# Patient Record
Sex: Female | Born: 1975 | Race: Black or African American | Hispanic: No | Marital: Married | Smoking: Never smoker
Health system: Southern US, Community
[De-identification: ages and names within clinical notes are randomized; demographics above are authoritative.]

---

## 1998-03-09 ENCOUNTER — Other Ambulatory Visit: Admission: RE | Admit: 1998-03-09 | Discharge: 1998-03-09 | Payer: Self-pay | Admitting: Obstetrics and Gynecology

## 1998-11-07 ENCOUNTER — Encounter: Admission: RE | Admit: 1998-11-07 | Discharge: 1999-02-05 | Payer: Self-pay | Admitting: Gynecology

## 1999-01-18 ENCOUNTER — Encounter: Payer: Self-pay | Admitting: *Deleted

## 1999-01-18 ENCOUNTER — Ambulatory Visit (HOSPITAL_COMMUNITY): Admission: RE | Admit: 1999-01-18 | Discharge: 1999-01-18 | Payer: Self-pay | Admitting: *Deleted

## 1999-05-14 ENCOUNTER — Inpatient Hospital Stay (HOSPITAL_COMMUNITY): Admission: AD | Admit: 1999-05-14 | Discharge: 1999-05-14 | Payer: Self-pay | Admitting: *Deleted

## 1999-06-14 ENCOUNTER — Inpatient Hospital Stay (HOSPITAL_COMMUNITY): Admission: AD | Admit: 1999-06-14 | Discharge: 1999-06-14 | Payer: Self-pay | Admitting: *Deleted

## 1999-06-18 ENCOUNTER — Encounter (HOSPITAL_COMMUNITY): Admission: RE | Admit: 1999-06-18 | Discharge: 1999-06-21 | Payer: Self-pay | Admitting: *Deleted

## 1999-06-20 ENCOUNTER — Inpatient Hospital Stay (HOSPITAL_COMMUNITY): Admission: AD | Admit: 1999-06-20 | Discharge: 1999-06-22 | Payer: Self-pay | Admitting: *Deleted

## 2004-01-12 ENCOUNTER — Other Ambulatory Visit: Admission: RE | Admit: 2004-01-12 | Discharge: 2004-01-12 | Payer: Self-pay | Admitting: Family Medicine

## 2005-03-18 ENCOUNTER — Other Ambulatory Visit: Admission: RE | Admit: 2005-03-18 | Discharge: 2005-03-18 | Payer: Self-pay | Admitting: Family Medicine

## 2005-08-01 ENCOUNTER — Emergency Department (HOSPITAL_COMMUNITY): Admission: EM | Admit: 2005-08-01 | Discharge: 2005-08-01 | Payer: Self-pay | Admitting: Emergency Medicine

## 2005-08-04 ENCOUNTER — Encounter: Admission: RE | Admit: 2005-08-04 | Discharge: 2005-08-04 | Payer: Self-pay | Admitting: Family Medicine

## 2006-05-06 ENCOUNTER — Other Ambulatory Visit: Admission: RE | Admit: 2006-05-06 | Discharge: 2006-05-06 | Payer: Self-pay | Admitting: Obstetrics and Gynecology

## 2006-06-02 ENCOUNTER — Inpatient Hospital Stay (HOSPITAL_COMMUNITY): Admission: AD | Admit: 2006-06-02 | Discharge: 2006-06-02 | Payer: Self-pay | Admitting: Obstetrics and Gynecology

## 2007-03-04 ENCOUNTER — Ambulatory Visit (HOSPITAL_COMMUNITY): Admission: RE | Admit: 2007-03-04 | Discharge: 2007-03-04 | Payer: Self-pay | Admitting: Obstetrics and Gynecology

## 2007-05-19 ENCOUNTER — Other Ambulatory Visit: Admission: RE | Admit: 2007-05-19 | Discharge: 2007-05-19 | Payer: Self-pay | Admitting: Obstetrics and Gynecology

## 2008-03-16 ENCOUNTER — Inpatient Hospital Stay (HOSPITAL_COMMUNITY): Admission: RE | Admit: 2008-03-16 | Discharge: 2008-03-18 | Payer: Self-pay | Admitting: Obstetrics and Gynecology

## 2008-06-19 ENCOUNTER — Other Ambulatory Visit: Admission: RE | Admit: 2008-06-19 | Discharge: 2008-06-19 | Payer: Self-pay | Admitting: Obstetrics and Gynecology

## 2009-12-18 ENCOUNTER — Other Ambulatory Visit: Admission: RE | Admit: 2009-12-18 | Discharge: 2009-12-18 | Payer: Self-pay | Admitting: Obstetrics and Gynecology

## 2010-11-19 NOTE — H&P (Signed)
NAME:  Karen Trevino, HECKART NO.:  [id number]   MEDICAL RECORD NO.:  [id number]          PATIENT TYPE:  INP   LOCATION:                                FACILITY:  [facility]   PHYSICIAN:  Gerald Leitz, MD          DATE OF BIRTH:  1976-05-12   DATE OF ADMISSION:  03/16/2008  DATE OF DISCHARGE:                              HISTORY & PHYSICAL   HISTORY OF PRESENT ILLNESS:  This is a 35 year old G23, P1-0-2-1-1 at 86  weeks' and 6 days' intrauterine pregnancy based on the last menstrual  period of June 04, 2007, confirmed by an 8-week ultrasound with  estimated date of delivery March 10, 2008.  Pregnancy complicated by  history of LEEP procedure.  The patient is post date and admitted for  induction.  Positive fetal movement.  No leakage of fluid.  No vaginal  bleeding.  No regular contractions.  GBS status is negative.   PAST OBSTETRIC HISTORY:  Spontaneous vaginal delivery in 2000, birth  weight was 8 pounds 7 ounces, miscarriage in November 2007 and August  2008.   PAST MEDICAL HISTORY:  Obesity.   PAST SURGICAL HISTORY:  LEEP procedure in 1990.   PAST GYNECOLOGIC HISTORY:  History of LEEP.  Last Pap smear in November  2008, this was normal.  History of HPV.  No other STDs.   MEDICATIONS:  Prenatal vitamins and iron sulfate.   ALLERGIES:  No known drug allergies.   SOCIAL HISTORY:  The patient is married.  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  She denies tobacco, alcohol, or illicit drug use.   FAMILY HISTORY:  Noncontributory.   PHYSICAL EXAMINATION:  VITAL SIGNS:  Blood pressure 130/78, weight is 60  pounds, fetal heart rate 140.  CARDIOVASCULAR:  Regular rate and rhythm.  LUNGS: Clear to auscultation bilaterally.  ABDOMEN:  Gravid, nontender.  EXTREMITIES:  1+ edema.  CERVIX:  2 cm, 50% of effaced, and ballotable.   LABORATORY RESULTS:  GBS negative.  HIV nonreactive.  Blood type is A+.  RPR nonreactive.  Rubella immune.  Hepatitis B negative.   ASSESSMENT AND PLAN:  A 40-week 6-day intrauterine pregnancy, post date  for induction.   PLAN:  Cytotec per vagina followed by probable Pitocin, anticipate  spontaneous vaginal delivery.      Gerald Leitz, MD  Electronically Signed     TC/MEDQ  D:  03/15/2008  T:  03/16/2008  Job:  [id number]

## 2011-04-09 LAB — CBC
HCT: 27.8 — ABNORMAL LOW
Hemoglobin: 10.9 — ABNORMAL LOW
Hemoglobin: 8.9 — ABNORMAL LOW
MCHC: 31.7
MCHC: 32.1
MCV: 79.4
MCV: 79.6
Platelets: 217
RBC: 3.49 — ABNORMAL LOW
RBC: 4.34
RDW: 17.6 — ABNORMAL HIGH
WBC: 10.1

## 2011-04-09 LAB — SYPHILIS: RPR W/REFLEX TO RPR TITER AND TREPONEMAL ANTIBODIES, TRADITIONAL SCREENING AND DIAGNOSIS ALGORITHM: RPR Ser Ql: NONREACTIVE

## 2011-07-02 ENCOUNTER — Other Ambulatory Visit: Payer: Self-pay | Admitting: Physician Assistant

## 2011-07-02 ENCOUNTER — Other Ambulatory Visit (HOSPITAL_COMMUNITY)
Admission: RE | Admit: 2011-07-02 | Discharge: 2011-07-02 | Disposition: A | Discharge status: Home / Self Care | Payer: Self-pay | Source: Ambulatory Visit | Attending: Family Medicine | Admitting: Family Medicine

## 2011-07-02 DIAGNOSIS — Z124 Encounter for screening for malignant neoplasm of cervix: Secondary | ICD-10-CM | POA: Insufficient documentation

## 2012-11-26 ENCOUNTER — Other Ambulatory Visit: Payer: Self-pay | Admitting: Physician Assistant

## 2012-11-26 ENCOUNTER — Other Ambulatory Visit (HOSPITAL_COMMUNITY)
Admission: RE | Admit: 2012-11-26 | Discharge: 2012-11-26 | Disposition: A | Discharge status: Home / Self Care | Payer: BC Managed Care – PPO | Source: Ambulatory Visit | Attending: Family Medicine | Admitting: Family Medicine

## 2012-11-26 DIAGNOSIS — Z124 Encounter for screening for malignant neoplasm of cervix: Secondary | ICD-10-CM | POA: Insufficient documentation

## 2014-02-16 ENCOUNTER — Other Ambulatory Visit: Payer: Self-pay | Admitting: Physician Assistant

## 2014-02-16 ENCOUNTER — Other Ambulatory Visit (HOSPITAL_COMMUNITY)
Admission: RE | Admit: 2014-02-16 | Discharge: 2014-02-16 | Disposition: A | Discharge status: Home / Self Care | Payer: BC Managed Care – PPO | Source: Ambulatory Visit | Attending: Family Medicine | Admitting: Family Medicine

## 2014-02-16 DIAGNOSIS — Z124 Encounter for screening for malignant neoplasm of cervix: Secondary | ICD-10-CM | POA: Diagnosis present

## 2014-02-17 LAB — CYTOLOGY - PAP

## 2014-11-13 ENCOUNTER — Emergency Department (HOSPITAL_COMMUNITY)
Admission: EM | Admit: 2014-11-13 | Discharge: 2014-11-14 | Disposition: A | Discharge status: Home / Self Care | Payer: BLUE CROSS/BLUE SHIELD | Attending: Emergency Medicine | Admitting: Emergency Medicine

## 2014-11-13 ENCOUNTER — Encounter (HOSPITAL_COMMUNITY): Payer: Self-pay | Admitting: Nurse Practitioner

## 2014-11-13 ENCOUNTER — Emergency Department (HOSPITAL_COMMUNITY): Payer: BLUE CROSS/BLUE SHIELD

## 2014-11-13 DIAGNOSIS — R112 Nausea with vomiting, unspecified: Secondary | ICD-10-CM | POA: Insufficient documentation

## 2014-11-13 DIAGNOSIS — Z3202 Encounter for pregnancy test, result negative: Secondary | ICD-10-CM | POA: Diagnosis not present

## 2014-11-13 DIAGNOSIS — R0789 Other chest pain: Secondary | ICD-10-CM | POA: Diagnosis not present

## 2014-11-13 DIAGNOSIS — R0602 Shortness of breath: Secondary | ICD-10-CM | POA: Insufficient documentation

## 2014-11-13 DIAGNOSIS — R079 Chest pain, unspecified: Secondary | ICD-10-CM | POA: Diagnosis present

## 2014-11-13 LAB — CBC
HCT: 41 % (ref 36.0–46.0)
Hemoglobin: 13.3 g/dL (ref 12.0–15.0)
MCH: 25.8 pg — AB (ref 26.0–34.0)
MCHC: 32.4 g/dL (ref 30.0–36.0)
MCV: 79.5 fL (ref 78.0–100.0)
PLATELETS: 263 10*3/uL (ref 150–400)
RBC: 5.16 MIL/uL — AB (ref 3.87–5.11)
RDW: 14.6 % (ref 11.5–15.5)
WBC: 7 10*3/uL (ref 4.0–10.5)

## 2014-11-13 LAB — I-STAT TROPONIN, ED
TROPONIN I, POC: 0 ng/mL (ref 0.00–0.08)
TROPONIN I, POC: 0 ng/mL (ref 0.00–0.08)

## 2014-11-13 LAB — BASIC METABOLIC PANEL
ANION GAP: 9 (ref 5–15)
BUN: 11 mg/dL (ref 6–20)
CHLORIDE: 105 mmol/L (ref 101–111)
CO2: 20 mmol/L — ABNORMAL LOW (ref 22–32)
CREATININE: 0.91 mg/dL (ref 0.44–1.00)
Calcium: 9.3 mg/dL (ref 8.9–10.3)
GFR calc non Af Amer: >60 mL/min (ref >60)
Glucose, Bld: 102 mg/dL — ABNORMAL HIGH (ref 70–99)
POTASSIUM: 3.5 mmol/L (ref 3.5–5.1)
SODIUM: 134 mmol/L — AB (ref 135–145)

## 2014-11-13 LAB — D-DIMER, QUANTITATIVE (NOT AT ARMC): D DIMER QUANT: 0.72 ug{FEU}/mL — AB (ref 0.00–0.48)

## 2014-11-13 LAB — LIPASE, BLOOD: LIPASE: 16 U/L — AB (ref 22–51)

## 2014-11-13 MED ORDER — ASPIRIN EC 325 MG PO TBEC
325.0000 mg | DELAYED_RELEASE_TABLET | Freq: Once | ORAL | Status: AC
  Administered 2014-11-13: 325 mg via ORAL
  Filled 2014-11-13: qty 1

## 2014-11-13 MED ORDER — SODIUM CHLORIDE 0.9 % IV BOLUS (SEPSIS)
1000.0000 mL | Freq: Once | INTRAVENOUS | Status: AC
  Administered 2014-11-13: 1000 mL via INTRAVENOUS

## 2014-11-13 MED ORDER — ONDANSETRON HCL 4 MG/2ML IJ SOLN
4.0000 mg | Freq: Once | INTRAMUSCULAR | Status: AC
  Administered 2014-11-13: 4 mg via INTRAVENOUS
  Filled 2014-11-13: qty 2

## 2014-11-13 MED ORDER — GI COCKTAIL ~~LOC~~
30.0000 mL | Freq: Once | ORAL | Status: AC
  Administered 2014-11-14: 30 mL via ORAL
  Filled 2014-11-13: qty 30

## 2014-11-13 NOTE — ED Notes (Signed)
Pt updated on plan of care,  

## 2014-11-13 NOTE — ED Notes (Signed)
MD at bedside. 

## 2014-11-13 NOTE — ED Notes (Signed)
She had an episode of sharp chest pain that resolved yesterday. Today she woke with n/v and then this afternoon began to have sharp pain in midsternal and L side of chest. She describes the pain as tight now. A&Ox4, resp e/u

## 2014-11-14 ENCOUNTER — Emergency Department (HOSPITAL_COMMUNITY): Payer: BLUE CROSS/BLUE SHIELD

## 2014-11-14 ENCOUNTER — Encounter (HOSPITAL_COMMUNITY): Payer: Self-pay

## 2014-11-14 LAB — HEPATIC FUNCTION PANEL
ALBUMIN: 3.7 g/dL (ref 3.5–5.0)
ALK PHOS: 85 U/L (ref 38–126)
ALT: 12 U/L — AB (ref 14–54)
AST: 18 U/L (ref 15–41)
Bilirubin, Direct: <0.1 mg/dL — ABNORMAL LOW (ref 0.1–0.5)
TOTAL PROTEIN: 7.5 g/dL (ref 6.5–8.1)
Total Bilirubin: 0.7 mg/dL (ref 0.3–1.2)

## 2014-11-14 LAB — POC URINE PREG, ED: Preg Test, Ur: NEGATIVE

## 2014-11-14 MED ORDER — IOHEXOL 350 MG/ML SOLN
100.0000 mL | Freq: Once | INTRAVENOUS | Status: AC | PRN
  Administered 2014-11-14: 100 mL via INTRAVENOUS

## 2014-11-14 NOTE — ED Notes (Signed)
Patient transported to CT 

## 2014-11-14 NOTE — ED Provider Notes (Signed)
CSN: [id number]     Arrival date & time 11/13/14  1737 History   First MD Initiated Contact with Patient 11/13/14 2143     Chief Complaint  Patient presents with  . Chest Pain     (Consider location/radiation/quality/duration/timing/severity/associated sxs/prior Treatment) Patient is a 39 y.o. female presenting with chest pain. The history is provided by the patient.  Chest Pain Pain location:  Substernal area Pain quality: sharp   Pain radiates to:  Does not radiate Pain radiates to the back: no   Pain severity:  Moderate Onset quality:  Gradual Duration:  1 day Timing:  Constant Progression:  Unchanged Chronicity:  New Context: at rest   Relieved by:  Nothing Worsened by:  Nothing tried Ineffective treatments:  None tried Associated symptoms: nausea, shortness of breath and vomiting   Associated symptoms: no abdominal pain, no altered mental status, no back pain, no claudication, no cough, no diaphoresis, no dizziness, no fatigue, no fever, no headache, no lower extremity edema, no near-syncope, no palpitations, no syncope and no weakness   Risk factors: obesity   Risk factors: no prior DVT/PE, no smoking and no surgery     History reviewed. No pertinent past medical history. History reviewed. No pertinent past surgical history. History reviewed. No pertinent family history. History  Substance Use Topics  . Smoking status: Never Smoker   . Smokeless tobacco: Not on file  . Alcohol Use: No   OB History    No data available     Review of Systems  Constitutional: Negative for fever, chills, diaphoresis, appetite change and fatigue.  HENT: Negative for congestion and rhinorrhea.   Respiratory: Positive for shortness of breath. Negative for cough, chest tightness and wheezing.   Cardiovascular: Positive for chest pain. Negative for palpitations, claudication, leg swelling, syncope and near-syncope.  Gastrointestinal: Positive for nausea and vomiting. Negative for  abdominal pain and diarrhea.  Musculoskeletal: Negative for back pain, neck pain and neck stiffness.  Skin: Negative for color change, pallor and rash.  Neurological: Negative for dizziness, weakness, light-headedness and headaches.  All other systems reviewed and are negative.     Allergies  Review of patient's allergies indicates no known allergies.  Home Medications   Prior to Admission medications   Medication Sig Start Date End Date Taking? Authorizing Provider  ibuprofen (ADVIL,MOTRIN) 200 MG tablet Take 600 mg by mouth every 6 (six) hours as needed for headache.   Yes Historical Provider, MD   BP 124/72 mmHg  Pulse 92  Temp(Src) 98.1 F (36.7 C) (Oral)  Resp 20  SpO2 100% Physical Exam  Constitutional: She is oriented to person, place, and time. She appears well-developed and well-nourished. No distress.  HENT:  Head: Normocephalic and atraumatic.  Mouth/Throat: Oropharynx is clear and moist.  Eyes: Conjunctivae and EOM are normal. Pupils are equal, round, and reactive to light.  Neck: Normal range of motion. Neck supple.  Cardiovascular: Normal rate, regular rhythm, normal heart sounds and intact distal pulses.  Exam reveals no gallop and no friction rub.   No murmur heard. Pulmonary/Chest: Effort normal and breath sounds normal. No respiratory distress. She has no wheezes. She has no rales. She exhibits no tenderness.  Abdominal: Soft. Bowel sounds are normal. She exhibits no distension. There is no tenderness. There is no rebound and no guarding.  Musculoskeletal: Normal range of motion. She exhibits no edema or tenderness.  Neurological: She is alert and oriented to person, place, and time. GCS eye subscore is 4. GCS verbal  subscore is 5. GCS motor subscore is 6.  Skin: She is not diaphoretic.  Nursing note and vitals reviewed.   ED Course  Procedures (including critical care time) Labs Review Labs Reviewed  CBC - Abnormal; Notable for the following:    RBC  5.16 (*)    MCH 25.8 (*)    All other components within normal limits  BASIC METABOLIC PANEL - Abnormal; Notable for the following:    Sodium 134 (*)    CO2 20 (*)    Glucose, Bld 102 (*)    All other components within normal limits  D-DIMER, QUANTITATIVE - Abnormal; Notable for the following:    D-Dimer, Quant 0.72 (*)    All other components within normal limits  LIPASE, BLOOD - Abnormal; Notable for the following:    Lipase 16 (*)    All other components within normal limits  HEPATIC FUNCTION PANEL - Abnormal; Notable for the following:    ALT 12 (*)    Bilirubin, Direct <0.1 (*)    All other components within normal limits  I-STAT TROPOININ, ED  I-STAT TROPOININ, ED  POC URINE PREG, ED    Imaging Review No results found.   EKG Interpretation   Date/Time:  Monday Nov 13 2014 17:43:01 EDT Ventricular Rate:  107 PR Interval:  174 QRS Duration: 82 QT Interval:  328 QTC Calculation: 437 R Axis:   35 Text Interpretation:  Sinus tachycardia Possible Anterior infarct , age  undetermined Abnormal ECG No previous ECGs available Confirmed by Manus GunningANCOUR   MD, STEPHEN [phone]([id number]) on 11/13/2014 9:59:39 PM      MDM   Final diagnoses:  Other chest pain    39 yo F with no significant PMH presenting with chest pain. Had one episode of chest pain yesterday which resolved spontaneously.  Today reports onset of substernal chest pain again, this time persistent so came to ED.  Pain sharp, non-radiating, with pleuritic component.  She reports developing non-bilious/non-bloody emesis and nausea following this.  No abdominal pain.  Mild SOB.  No LE edema, calf pain or swelling.  No prior h/o VTE, OCP use, recent immobilizations.  On presentation, pt alert, VSS, in NAD.  CV and lung exam WNL.  Abdomen soft, non-tender, non-distended. No LE edema or s/sx of DVT.  Exam otherwise WNL.  EKG obtained as above- sinus tachycardia, non-specific T wave abnormality, no acute ischemic changes.  Low risk for  ACS per HEAR score- plan for delta troponin.  Will check d-dimer for possible DVT.  Also possible viral illness in setting of N/V.  Workup significant for positive d-dimer.  Delta troponin negative.  CXR WNL.  Plan for CTA chest to r/o PE.  Care of pt to oncoming ED team- plan for d/c with PCP f/u if CT negative.  Discussed with attending Dr. Manus Gunningancour.    Jodean LimaEmily Mata Rowen, MD 11/17/14 1004  Glynn OctaveStephen Rancour, MD 11/17/14 [phone]

## 2014-11-14 NOTE — Discharge Instructions (Signed)

## 2015-05-29 ENCOUNTER — Other Ambulatory Visit: Payer: Self-pay | Admitting: Physician Assistant

## 2015-05-29 ENCOUNTER — Other Ambulatory Visit (HOSPITAL_COMMUNITY)
Admission: RE | Admit: 2015-05-29 | Discharge: 2015-05-29 | Disposition: A | Discharge status: Home / Self Care | Payer: BLUE CROSS/BLUE SHIELD | Source: Ambulatory Visit | Attending: Family Medicine | Admitting: Family Medicine

## 2015-05-29 DIAGNOSIS — Z124 Encounter for screening for malignant neoplasm of cervix: Secondary | ICD-10-CM | POA: Diagnosis present

## 2015-06-04 LAB — CYTOLOGY - PAP

## 2015-12-23 DIAGNOSIS — J019 Acute sinusitis, unspecified: Secondary | ICD-10-CM | POA: Diagnosis not present

## 2016-04-14 ENCOUNTER — Other Ambulatory Visit: Payer: Self-pay | Admitting: Pediatrics

## 2016-06-18 DIAGNOSIS — Z23 Encounter for immunization: Secondary | ICD-10-CM | POA: Diagnosis not present

## 2016-06-18 DIAGNOSIS — Z0000 Encounter for general adult medical examination without abnormal findings: Secondary | ICD-10-CM | POA: Diagnosis not present

## 2016-06-18 DIAGNOSIS — E669 Obesity, unspecified: Secondary | ICD-10-CM | POA: Diagnosis not present

## 2016-08-06 DIAGNOSIS — Z1231 Encounter for screening mammogram for malignant neoplasm of breast: Secondary | ICD-10-CM | POA: Diagnosis not present

## 2017-01-14 IMAGING — DX DG CHEST 2V
2 series · 2 of 2 positions shown · non-contrast
Comparison: None.

CLINICAL DATA: Intermittent chest pain today.  Shortness of breath.

EXAM:
CHEST  2 VIEW

[chest pa]
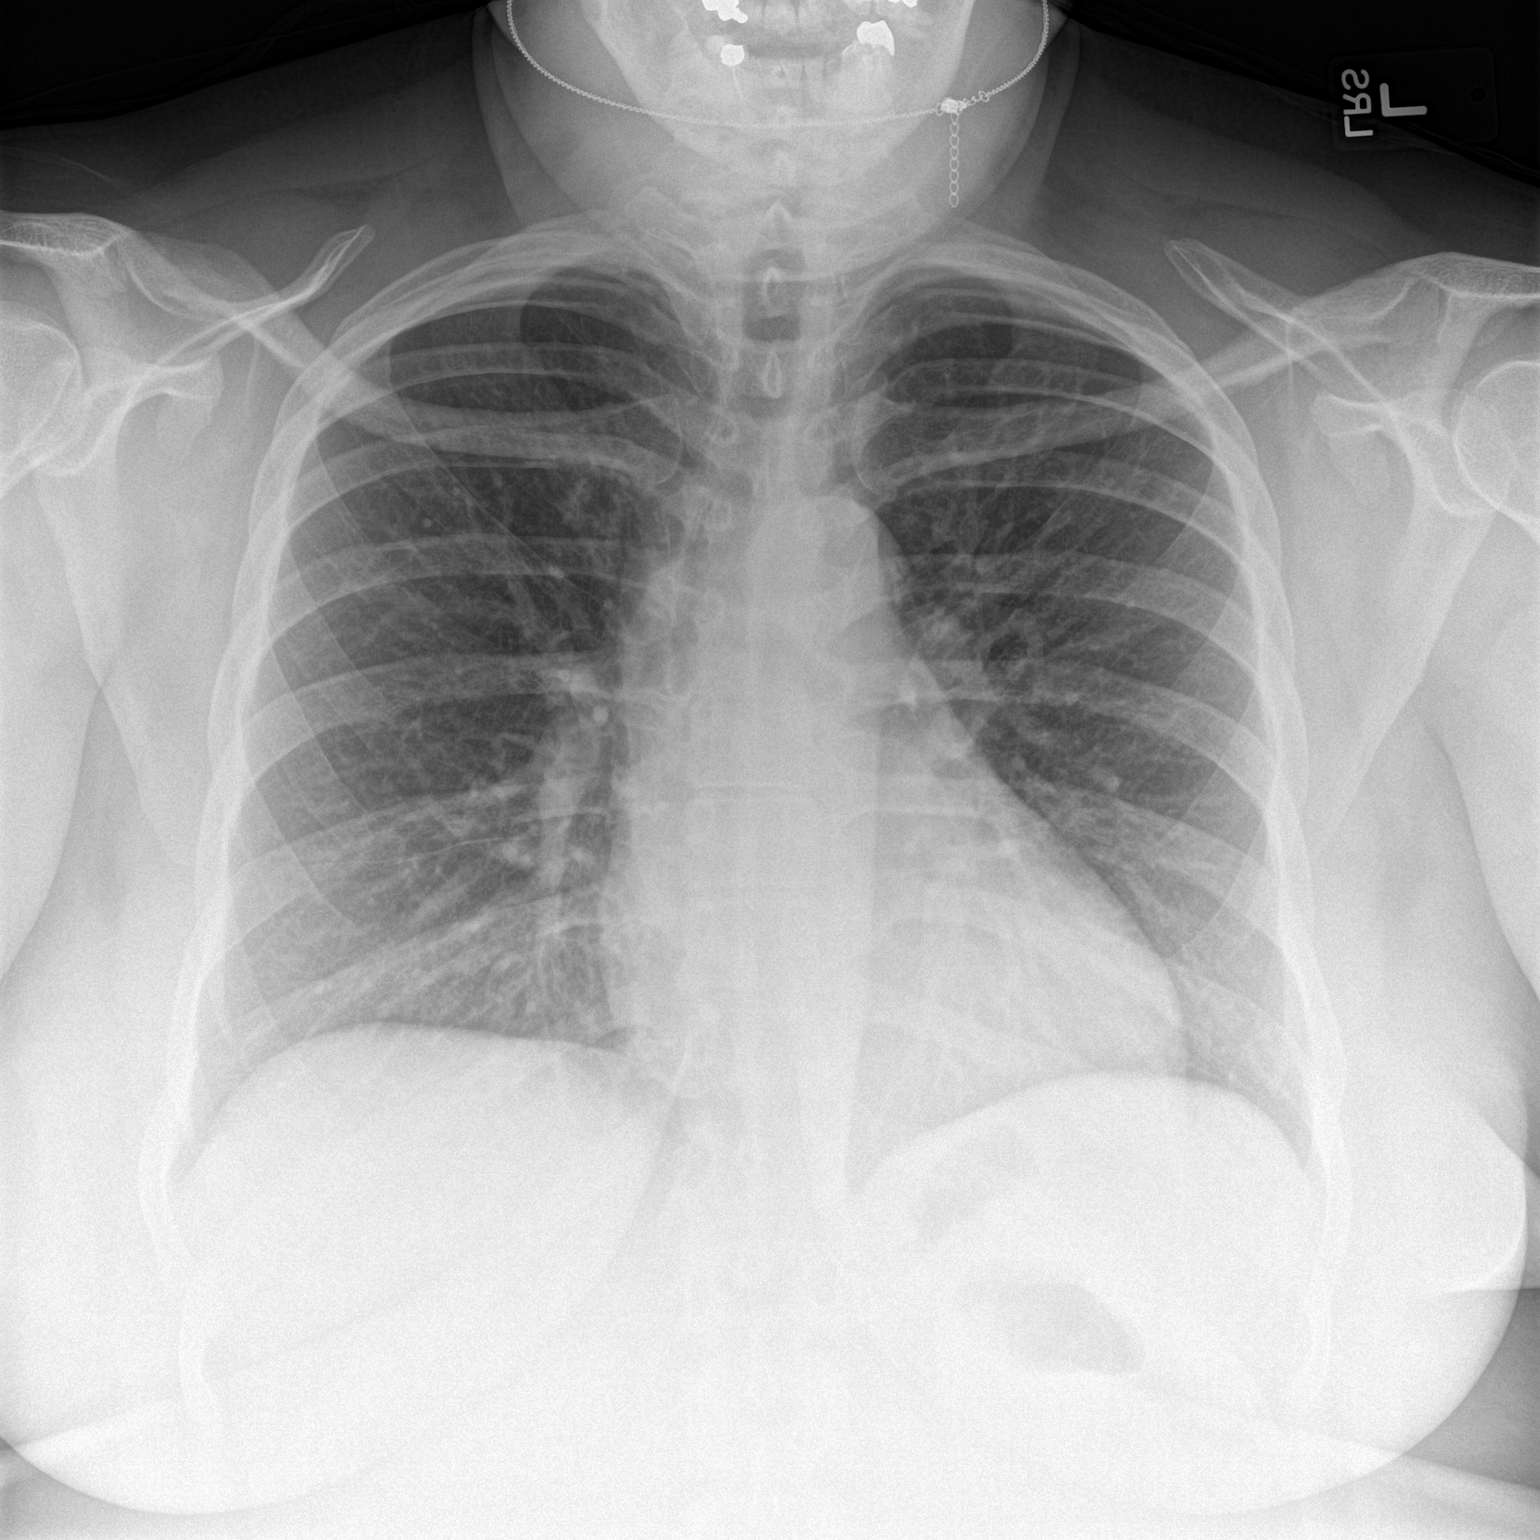

[chest lat]
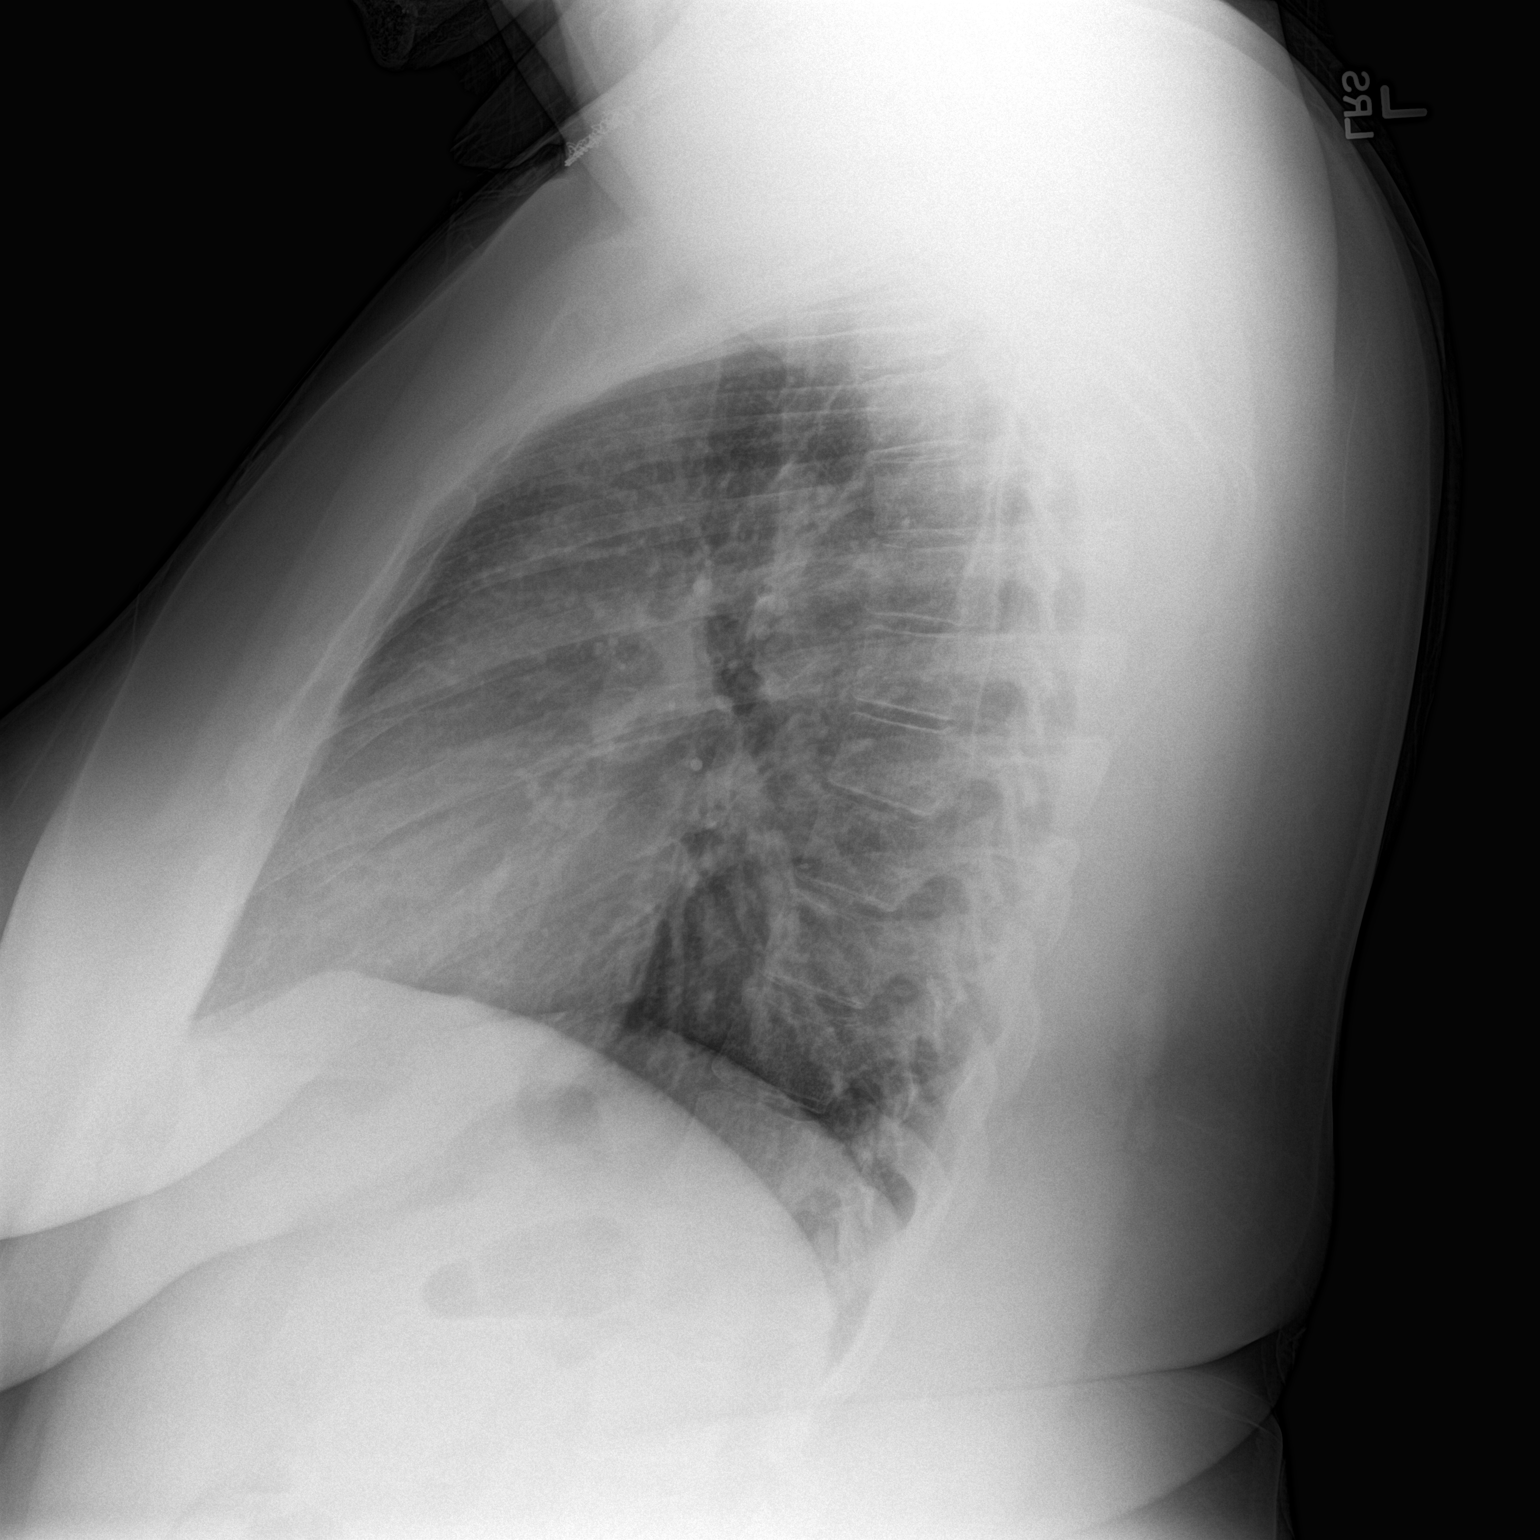

[2 of 2 positions shown; findings below may reference images not displayed]

FINDINGS: Normal sized heart. Clear lungs with normal vascularity.
Unremarkable bones.
IMPRESSION: Normal examination.

## 2017-05-08 ENCOUNTER — Other Ambulatory Visit: Payer: Self-pay | Admitting: Physician Assistant

## 2017-05-08 ENCOUNTER — Other Ambulatory Visit (HOSPITAL_COMMUNITY)
Admission: RE | Admit: 2017-05-08 | Discharge: 2017-05-08 | Disposition: A | Discharge status: Home / Self Care | Payer: BLUE CROSS/BLUE SHIELD | Source: Ambulatory Visit | Attending: Family Medicine | Admitting: Family Medicine

## 2017-05-08 DIAGNOSIS — Z0000 Encounter for general adult medical examination without abnormal findings: Secondary | ICD-10-CM | POA: Diagnosis not present

## 2017-05-08 DIAGNOSIS — Z124 Encounter for screening for malignant neoplasm of cervix: Secondary | ICD-10-CM | POA: Diagnosis not present

## 2017-05-08 DIAGNOSIS — Z01419 Encounter for gynecological examination (general) (routine) without abnormal findings: Secondary | ICD-10-CM | POA: Insufficient documentation

## 2017-05-08 DIAGNOSIS — Z23 Encounter for immunization: Secondary | ICD-10-CM | POA: Diagnosis not present

## 2017-05-12 LAB — CYTOLOGY - PAP: Diagnosis: NEGATIVE

## 2017-08-06 DIAGNOSIS — J329 Chronic sinusitis, unspecified: Secondary | ICD-10-CM | POA: Diagnosis not present

## 2017-08-10 DIAGNOSIS — Z1231 Encounter for screening mammogram for malignant neoplasm of breast: Secondary | ICD-10-CM | POA: Diagnosis not present

## 2018-07-08 DIAGNOSIS — R7301 Impaired fasting glucose: Secondary | ICD-10-CM | POA: Diagnosis not present

## 2018-07-08 DIAGNOSIS — Z0000 Encounter for general adult medical examination without abnormal findings: Secondary | ICD-10-CM | POA: Diagnosis not present

## 2018-07-08 DIAGNOSIS — Z13220 Encounter for screening for lipoid disorders: Secondary | ICD-10-CM | POA: Diagnosis not present

## 2018-08-16 DIAGNOSIS — Z1231 Encounter for screening mammogram for malignant neoplasm of breast: Secondary | ICD-10-CM | POA: Diagnosis not present

## 2019-08-22 DIAGNOSIS — Z1231 Encounter for screening mammogram for malignant neoplasm of breast: Secondary | ICD-10-CM | POA: Diagnosis not present

## 2019-10-27 DIAGNOSIS — Z01419 Encounter for gynecological examination (general) (routine) without abnormal findings: Secondary | ICD-10-CM | POA: Diagnosis not present

## 2019-10-27 DIAGNOSIS — Z3202 Encounter for pregnancy test, result negative: Secondary | ICD-10-CM | POA: Diagnosis not present

## 2019-10-27 DIAGNOSIS — Z30433 Encounter for removal and reinsertion of intrauterine contraceptive device: Secondary | ICD-10-CM | POA: Diagnosis not present

## 2019-10-27 DIAGNOSIS — Z13220 Encounter for screening for lipoid disorders: Secondary | ICD-10-CM | POA: Diagnosis not present

## 2019-10-27 DIAGNOSIS — Z131 Encounter for screening for diabetes mellitus: Secondary | ICD-10-CM | POA: Diagnosis not present

## 2019-11-07 ENCOUNTER — Emergency Department (HOSPITAL_COMMUNITY)
Admission: EM | Admit: 2019-11-07 | Discharge: 2019-11-07 | Disposition: A | Discharge status: Home / Self Care | Payer: BC Managed Care – PPO | Attending: Emergency Medicine | Admitting: Emergency Medicine

## 2019-11-07 ENCOUNTER — Other Ambulatory Visit: Payer: Self-pay

## 2019-11-07 ENCOUNTER — Encounter (HOSPITAL_COMMUNITY): Payer: Self-pay | Admitting: Emergency Medicine

## 2019-11-07 DIAGNOSIS — R42 Dizziness and giddiness: Secondary | ICD-10-CM | POA: Insufficient documentation

## 2019-11-07 LAB — COMPREHENSIVE METABOLIC PANEL
ALT: 27 U/L (ref 0–44)
AST: 17 U/L (ref 15–41)
Albumin: 3.4 g/dL — ABNORMAL LOW (ref 3.5–5.0)
Alkaline Phosphatase: 78 U/L (ref 38–126)
Anion gap: 11 (ref 5–15)
BUN: 8 mg/dL (ref 6–20)
CO2: 22 mmol/L (ref 22–32)
Calcium: 9 mg/dL (ref 8.9–10.3)
Chloride: 107 mmol/L (ref 98–111)
Creatinine, Ser: 0.79 mg/dL (ref 0.44–1.00)
GFR calc Af Amer: >60 mL/min (ref >60)
GFR calc non Af Amer: >60 mL/min (ref >60)
Glucose, Bld: 100 mg/dL — ABNORMAL HIGH (ref 70–99)
Potassium: 3.7 mmol/L (ref 3.5–5.1)
Sodium: 140 mmol/L (ref 135–145)
Total Bilirubin: 0.4 mg/dL (ref 0.3–1.2)
Total Protein: 6.7 g/dL (ref 6.5–8.1)

## 2019-11-07 LAB — CBC WITH DIFFERENTIAL/PLATELET
Abs Immature Granulocytes: 0.01 10*3/uL (ref 0.00–0.07)
Basophils Absolute: 0 10*3/uL (ref 0.0–0.1)
Basophils Relative: 1 %
Eosinophils Absolute: 0.1 10*3/uL (ref 0.0–0.5)
Eosinophils Relative: 2 %
HCT: 39.3 % (ref 36.0–46.0)
Hemoglobin: 12.6 g/dL (ref 12.0–15.0)
Immature Granulocytes: 0 %
Lymphocytes Relative: 26 %
Lymphs Abs: 0.9 10*3/uL (ref 0.7–4.0)
MCH: 26.6 pg (ref 26.0–34.0)
MCHC: 32.1 g/dL (ref 30.0–36.0)
MCV: 82.9 fL (ref 80.0–100.0)
Monocytes Absolute: 0.2 10*3/uL (ref 0.1–1.0)
Monocytes Relative: 6 %
Neutro Abs: 2.3 10*3/uL (ref 1.7–7.7)
Neutrophils Relative %: 65 %
Platelets: 283 10*3/uL (ref 150–400)
RBC: 4.74 MIL/uL (ref 3.87–5.11)
RDW: 14.6 % (ref 11.5–15.5)
WBC: 3.5 10*3/uL — ABNORMAL LOW (ref 4.0–10.5)
nRBC: 0 % (ref 0.0–0.2)

## 2019-11-07 LAB — URINALYSIS, ROUTINE W REFLEX MICROSCOPIC
Bilirubin Urine: NEGATIVE
Glucose, UA: NEGATIVE mg/dL
Ketones, ur: NEGATIVE mg/dL
Nitrite: NEGATIVE
Protein, ur: NEGATIVE mg/dL
RBC / HPF: >50 RBC/hpf — ABNORMAL HIGH (ref 0–5)
Specific Gravity, Urine: 1.01 (ref 1.005–1.030)
pH: 9 — ABNORMAL HIGH (ref 5.0–8.0)

## 2019-11-07 LAB — I-STAT BETA HCG BLOOD, ED (MC, WL, AP ONLY): I-stat hCG, quantitative: <5 m[IU]/mL (ref <5)

## 2019-11-07 LAB — TROPONIN I (HIGH SENSITIVITY): Troponin I (High Sensitivity): <2 ng/L (ref <18)

## 2019-11-07 LAB — LIPASE, BLOOD: Lipase: 19 U/L (ref 11–51)

## 2019-11-07 MED ORDER — MECLIZINE HCL 12.5 MG PO TABS
12.5000 mg | ORAL_TABLET | Freq: Three times a day (TID) | ORAL | 0 refills | Status: DC | PRN
Start: 2019-11-07 — End: 2021-09-17

## 2019-11-07 MED ORDER — ONDANSETRON 4 MG PO TBDP
4.0000 mg | ORAL_TABLET | Freq: Once | ORAL | Status: AC
  Administered 2019-11-07: 4 mg via ORAL
  Filled 2019-11-07: qty 1

## 2019-11-07 NOTE — ED Triage Notes (Signed)
Pt here from home with c/o dizziness and room spinning , along with some slight nausea

## 2019-11-07 NOTE — Discharge Instructions (Addendum)
Per our discussion, I am going to place you on a short course of meclizine.  You can take this as needed up to 3 times a day for your dizziness.  I think your symptoms are likely secondary to an ear effusion.  This is likely caused by her seasonal allergies.  I would recommend switching from Zyrtec and begin taking Claritin or Xyzal.  I would also recommend you start taking Flonase.  You can take this once to twice a day with 1 to 2 puffs in each nostril.  Please follow-up with your primary care provider regarding this visit in the next week.  If your symptoms worsen please not hesitate to return to the emergency department.  It was a pleasure to meet you.

## 2019-11-07 NOTE — ED Provider Notes (Signed)
[facility] EMERGENCY DEPARTMENT Provider Note   CSN: [id number] Arrival date & time: 11/07/19  8250     History No chief complaint on file.   Karen Trevino is a 44 y.o. female.  HPI HPI Comments: Karen Trevino is a 44 y.o. female who presents to the Emergency Department complaining of dizziness.  Patient states she woke up this morning and sat up in bed and felt like the "room was spinning".  She states this lasted for about 2 to 3 hours.  Her husband was at home and decided to bring her to the emergency department.  She reports dizziness when she arrived as well as difficulty ambulating.  She states she wanted to "lean to the left" when ambulating.  She was given Zofran upon arrival which she states alleviated her nausea.  She denies any vomiting.  She reports a history of seasonal allergies and has been taking Zyrtec for a long period of time for this.  She states for the last 2 to 3 weeks she has been experiencing sinus congestion as well as intermittent headaches consistent with her prior seasonal allergy symptoms.  She denies any abdominal pain, vomiting, diarrhea, chest pain, shortness of breath.    No past medical history on file.  There are no problems to display for this patient.   No past surgical history on file.   OB History   No obstetric history on file.     No family history on file.  Social History   Tobacco Use  . Smoking status: Never Smoker  . Smokeless tobacco: Never Used  Substance Use Topics  . Alcohol use: No  . Drug use: No    Home Medications Prior to Admission medications   Medication Sig Start Date End Date Taking? Authorizing Provider  cetirizine (ZYRTEC) 10 MG tablet Take 10 mg by mouth daily as needed for allergies.   Yes [provider]    Allergies    Patient has no known allergies.  Review of Systems   Review of Systems  All other systems reviewed and are negative. Ten systems reviewed and are  negative for acute change, except as noted in the HPI.   Physical Exam Updated Vital Signs BP (!) 109/52   Pulse 72   Temp 98 F (36.7 C) (Oral)   Resp 16   Ht 5\' 10"  (1.778 m)   Wt (!) 156.9 kg   SpO2 97%   BMI 49.65 kg/m   Physical Exam Vitals and nursing note reviewed.  Constitutional:      General: She is not in acute distress.    Appearance: Normal appearance. She is not ill-appearing, toxic-appearing or diaphoretic.  HENT:     Head: Normocephalic and atraumatic.     Right Ear: Tympanic membrane and ear canal normal.     Left Ear: Tympanic membrane, ear canal and external ear normal.     Ears:     Comments: Middle ear effusion noted bilaterally.  No erythema or bulging TM noted bilaterally.  No pain with manipulation of the pinna or auricle.     Nose: Nose normal.     Mouth/Throat:     Mouth: Mucous membranes are moist.     Pharynx: Oropharynx is clear. No oropharyngeal exudate or posterior oropharyngeal erythema.  Eyes:     Extraocular Movements: Extraocular movements intact.     Pupils: Pupils are equal, round, and reactive to light.  Cardiovascular:     Rate and Rhythm: Normal  rate and regular rhythm.     Pulses: Normal pulses.     Heart sounds: Normal heart sounds. No murmur. No friction rub. No gallop.   Pulmonary:     Effort: Pulmonary effort is normal. No respiratory distress.     Breath sounds: Normal breath sounds. No stridor. No wheezing, rhonchi or rales.  Abdominal:     General: Abdomen is flat.     Palpations: Abdomen is soft.     Tenderness: There is no abdominal tenderness.  Musculoskeletal:        General: Normal range of motion.     Cervical back: Normal range of motion and neck supple. No tenderness.  Skin:    General: Skin is warm and dry.  Neurological:     General: No focal deficit present.     Mental Status: She is alert and oriented to person, place, and time.     Comments: Distal sensation intact.  Strength is 5 out of 5 in the bilateral  upper and lower extremities.  Finger-to-nose intact bilaterally with no visible signs of ataxia.  Negative pronator drift.  Patient able to ambulate with steady gait.  Extraocular movements intact.  No nystagmus visualized.  Psychiatric:        Mood and Affect: Mood normal.        Behavior: Behavior normal.    ED Results / Procedures / Treatments   Labs (all labs ordered are listed, but only abnormal results are displayed) Labs Reviewed  COMPREHENSIVE METABOLIC PANEL - Abnormal; Notable for the following components:      Result Value   Glucose, Bld 100 (*)    Albumin 3.4 (*)    All other components within normal limits  CBC WITH DIFFERENTIAL/PLATELET - Abnormal; Notable for the following components:   WBC 3.5 (*)    All other components within normal limits  URINALYSIS, ROUTINE W REFLEX MICROSCOPIC - Abnormal; Notable for the following components:   APPearance HAZY (*)    pH 9.0 (*)    Hgb urine dipstick LARGE (*)    Leukocytes,Ua MODERATE (*)    RBC / HPF >50 (*)    Bacteria, UA RARE (*)    All other components within normal limits  LIPASE, BLOOD  I-STAT BETA HCG BLOOD, ED ([facility], [facility], [facility] ONLY)  TROPONIN I (HIGH SENSITIVITY)   EKG None  Radiology No results found.  Procedures Procedures  Medications Ordered in ED Medications  ondansetron (ZOFRAN-ODT) disintegrating tablet 4 mg (4 mg Oral Given 11/07/19 0900)   ED Course  I have reviewed the triage vital signs and the nursing notes.  Pertinent labs & imaging results that were available during my care of the patient were reviewed by me and considered in my medical decision making (see chart for details).    MDM Rules/Calculators/A&P                      Patient is a pleasant 43 year old female that has signs and symptoms consistent with peripheral vertigo.  Her physical exam is very reassuring.  Her neurological exam is benign.  Her vital signs have been within normal limits since arrival.  She was given some Zofran in  triage for nausea which mostly alleviated her symptoms.  Her laboratory results are generally reassuring.  She does have some hemoglobin and RBCs in her urine.  I discussed this with her and she notes that she had her Mirena IUD replaced about 4 days ago and started spotting this morning.  Otherwise she denies any urinary complaints or vaginal discharge at this time.  Her physical exam was significant for bilateral middle ear effusion.  She has a history of seasonal allergies.  She has been taking Zyrtec for a long period of time.  We discussed switching Zyrtec to Xyzal or Claritin.  I also discussed having her start Flonase once to twice daily.  I placed her on a short course of meclizine as well for her acute dizziness.  She can take this up to 3 times daily for symptomatic relief.  I recommended she follow-up with her primary care provider regarding this visit as well as her symptoms.  She understands she can return to the emergency department with any new or worsening symptoms.  Her questions were answered and she was amicable with the above plan.  Her vital signs are stable at the time of discharge.  Patient discharged to home/self care.  Condition at discharge: Stable  Note: Portions of this report may have been transcribed using voice recognition software. Every effort was made to ensure accuracy; however, inadvertent computerized transcription errors may be present.     Final Clinical Impression(s) / ED Diagnoses Final diagnoses:  Vertigo    Rx / DC Orders ED Discharge Orders         Ordered    meclizine (ANTIVERT) 12.5 MG tablet  3 times daily PRN     11/07/19 1242           Placido Sou, PA-C 11/07/19 1327    Margarita Grizzle, MD 11/09/19 1202

## 2019-11-16 DIAGNOSIS — R42 Dizziness and giddiness: Secondary | ICD-10-CM | POA: Diagnosis not present

## 2019-12-07 DIAGNOSIS — T8339XA Other mechanical complication of intrauterine contraceptive device, initial encounter: Secondary | ICD-10-CM | POA: Diagnosis not present

## 2019-12-07 DIAGNOSIS — Z30431 Encounter for routine checking of intrauterine contraceptive device: Secondary | ICD-10-CM | POA: Diagnosis not present

## 2019-12-08 ENCOUNTER — Other Ambulatory Visit: Payer: Self-pay | Admitting: Obstetrics and Gynecology

## 2019-12-08 DIAGNOSIS — T8332XA Displacement of intrauterine contraceptive device, initial encounter: Secondary | ICD-10-CM

## 2019-12-13 ENCOUNTER — Ambulatory Visit
Admission: RE | Admit: 2019-12-13 | Discharge: 2019-12-13 | Disposition: A | Discharge status: Home / Self Care | Payer: BC Managed Care – PPO | Source: Ambulatory Visit | Attending: Obstetrics and Gynecology | Admitting: Obstetrics and Gynecology

## 2019-12-13 DIAGNOSIS — D259 Leiomyoma of uterus, unspecified: Secondary | ICD-10-CM | POA: Diagnosis not present

## 2019-12-13 DIAGNOSIS — T8332XA Displacement of intrauterine contraceptive device, initial encounter: Secondary | ICD-10-CM

## 2020-07-11 DIAGNOSIS — J3489 Other specified disorders of nose and nasal sinuses: Secondary | ICD-10-CM | POA: Diagnosis not present

## 2020-07-11 DIAGNOSIS — R059 Cough, unspecified: Secondary | ICD-10-CM | POA: Diagnosis not present

## 2020-08-22 DIAGNOSIS — Z1231 Encounter for screening mammogram for malignant neoplasm of breast: Secondary | ICD-10-CM | POA: Diagnosis not present

## 2021-02-14 DIAGNOSIS — Z0000 Encounter for general adult medical examination without abnormal findings: Secondary | ICD-10-CM | POA: Diagnosis not present

## 2021-02-14 DIAGNOSIS — Z13220 Encounter for screening for lipoid disorders: Secondary | ICD-10-CM | POA: Diagnosis not present

## 2021-02-14 DIAGNOSIS — R7303 Prediabetes: Secondary | ICD-10-CM | POA: Diagnosis not present

## 2021-07-23 ENCOUNTER — Other Ambulatory Visit: Payer: Self-pay | Admitting: Gastroenterology

## 2021-07-23 DIAGNOSIS — Z01818 Encounter for other preprocedural examination: Secondary | ICD-10-CM | POA: Diagnosis not present

## 2021-09-04 DIAGNOSIS — Z1231 Encounter for screening mammogram for malignant neoplasm of breast: Secondary | ICD-10-CM | POA: Diagnosis not present

## 2021-09-16 ENCOUNTER — Encounter (HOSPITAL_COMMUNITY): Payer: Self-pay | Admitting: Gastroenterology

## 2021-09-16 NOTE — Progress Notes (Signed)
Attempted to obtain medical history via telephone, unable to reach at this time. I left a voicemail to return pre surgical testing department's phone call.  

## 2021-09-23 NOTE — Anesthesia Preprocedure Evaluation (Addendum)
Anesthesia Evaluation  ?Patient identified by MRN, date of birth, ID band ?Patient awake ? ? ? ?Reviewed: ?Allergy & Precautions, NPO status , Patient's Chart, lab work & pertinent test results ? ?Airway ?Mallampati: II ? ?TM Distance: >3 FB ?Neck ROM: Full ? ? ? Dental ?no notable dental hx. ?(+) Teeth Intact, Dental Advisory Given ?  ?Pulmonary ?neg pulmonary ROS,  ?  ?Pulmonary exam normal ?breath sounds clear to auscultation ? ? ? ? ? ? Cardiovascular ?Exercise Tolerance: Good ?negative cardio ROS ?Normal cardiovascular exam ?Rhythm:Regular Rate:Normal ? ? ?  ?Neuro/Psych ?negative neurological ROS ? negative psych ROS  ? GI/Hepatic ?  ?Endo/Other  ?Morbid obesity (BMI  50.3) ? Renal/GU ?  ? ?  ?Musculoskeletal ? ? Abdominal ?(+) + obese,   ?Peds ? Hematology ?  ?Anesthesia Other Findings ? ? Reproductive/Obstetrics ? ?  ? ? ? ? ? ? ? ? ? ? ? ? ? ?  ?  ? ? ? ? ? ? ?Anesthesia Physical ?Anesthesia Plan ? ?ASA: 3 ? ?Anesthesia Plan: MAC  ? ?Post-op Pain Management:   ? ?Induction:  ? ?PONV Risk Score and Plan: Treatment may vary due to age or medical condition ? ?Airway Management Planned: Natural Airway and Nasal Cannula ? ?Additional Equipment: None ? ?Intra-op Plan:  ? ?Post-operative Plan:  ? ?Informed Consent: I have reviewed the patients History and Physical, chart, labs and discussed the procedure including the risks, benefits and alternatives for the proposed anesthesia with the patient or authorized representative who has indicated his/her understanding and acceptance.  ? ? ? ?Dental advisory given ? ?Plan Discussed with: CRNA and Anesthesiologist ? ?Anesthesia Plan Comments: (Screening colonoscopy under MAC)  ? ? ? ? ?Anesthesia Quick Evaluation ? ?

## 2021-09-23 NOTE — H&P (Signed)
History of Present Illness  ?General:  ?46 year old female, new patient, is referred by Milus Height, PA-C to schedule her first screening colonoscopy due to age. She has no family history of colon cancer. She denies any GI symptoms such as abdominal pain, heartburn, dysphagia, bowel irregularities, or rectal bleeding. She has never had a colonoscopy. He has morbid obesity, BMI 50.31. No other health concerns.  ? ?Current Medications  ?Taking   ?Mirena 20 MCG/24HR Intrauterine Device as directed Intrauterine   ?Claritin(Loratadine) 10 MG Tablet 1 tablet Orally Once a day, Notes: prn  ?Flonase Allergy Relief(Fluticasone Propionate) 50 MCG/ACT Suspension 1 spray in each nostril Nasally Once a day, Notes: prn  ?Not-Taking   ?Align - Capsule as directed Orally   ?Meclizine HCl 12.5 MG Tablet 1 tablet Orally three times a day PRN  ?Medication List reviewed and reconciled with the patient  ? ?Past Medical History  ?Obesity. ?  ?abn pap: LEEP at age 23. ?  ?Prediabetes (2017). ?  ?Allergies- seasonal. ?  ? ?Surgical History  ?None   ? ?Family History  ?Father: alive 79 yrs, diagnosed with Diabetes, Hypertension  ?Mother: alive 79 yrs, thyroid disease  ?Paternal Grand Father: deceased  ?Paternal Grand Mother: alive, diabetes  ?Maternal Grand Father: alive  ?Maternal Grand Mother: deceased, diabetes  ?Brother 1: alive 30 yrs  ?Brother2: alive 35 yrs  ?Brother 3: deceased 15 yrs, MVA  ?Sister 1: alive 2 yrs  ?3 brother(s) , 1 sister(s) . 1 son(s) , 1 daughter(s) .   ? ?Social History  ?General:  ?Tobacco use  ?cigarettes: Never smoked ?Tobacco history last updated 07/23/2021 ?Vaping No ?no EXPOSURE TO PASSIVE SMOKE.  ?no Alcohol.  ?Caffeine: yes, soda, occasionally.  ?no Recreational drug use.  ?Exercise: currently not exercising.  ?DENTAL CARE: good.  ?Marital Status: married.  ?Children: 1, Boys, 1, girls.  ?OCCUPATION: employed, [employer].  ?COMMUNICATION BARRIERS: none.   ? ?Allergies  ?N.K.D.A.   ? ?Hospitalization/Major Diagnostic Procedure  ?childbirth x 2 (SVD)   ?not in the past year 07/2021  ? ?Review of Systems  ?GI PROCEDURE:  ?Pacemaker/ AICD no. Artificial heart valves no. MI/heart attack no. Abnormal heart rhythm no. Angina no. CVA no. Hypertension no. Hypotension no. Asthma, COPD no. Sleep apnea no. Seizure disorders no. Artificial joints no. Severe DJD no. Diabetes no. Significant headaches no. Vertigo YES. Depression/anxiety no. Abnormal bleeding no. Kidney Disease no. Liver disease no. Chance of pregnancy no. Blood transfusion no  ? ? ?Vital Signs  ?Wt 335.8, Wt change 3.2 lb, Ht 68.5, BMI 50.31, Temp 98.0, Pulse sitting 78, BP sitting 132/81.  ? ?Examination  ?Gastroenterology Exam: ?GENERAL APPEARANCE: Well developed, well nourished, obese, no active distress, pleasant, no acute distress.  ?SCLERA: anicteric.  ?RESPIRATORY Breath sounds clear to auscultation. No wheezes, rales or rhonchi. Respiration even and unlabored.  ?CARDIOVASCULAR Normal RRR w/o murmers or gallops. No peripheral edema.  ?ABDOMEN No masses palpated. Liver and spleen not palpated, normal. Bowel sounds normal, Abdomen soft, obese, no tenderness.Marland Kitchen  ?RECTAL: Stool brown, heme-negative. No rectal lesions.  ?  ? ?Assessments  ?  ? ?1. Colon cancer screening - Z12.11 (Primary)  ? ?2. Morbid obesity - E66.01, BMI 50.31  ? ?Treatment  ?1. Colon cancer screening  ?IMAGING: Colonoscopy   ?   ? ?I discussed risks and benefits of colonoscopy procedure with patient to include risk of bleeding or colon perforation. Patient expressed understanding and agrees to proceed with procedure. Colon cancer screening guidelines were discussed with patient.   ? ?  2. Morbid obesity  ?Notes: Schedule Colonoscopy Procedure in Hospital due to BMI >50.   ? ?

## 2021-09-24 ENCOUNTER — Ambulatory Visit (HOSPITAL_COMMUNITY): Payer: BC Managed Care – PPO | Admitting: Anesthesiology

## 2021-09-24 ENCOUNTER — Other Ambulatory Visit: Payer: Self-pay

## 2021-09-24 ENCOUNTER — Encounter (HOSPITAL_COMMUNITY)
Admission: RE | Disposition: A | Discharge status: Home / Self Care | Payer: Self-pay | Source: Ambulatory Visit | Attending: Gastroenterology

## 2021-09-24 ENCOUNTER — Encounter (HOSPITAL_COMMUNITY): Payer: Self-pay | Admitting: Gastroenterology

## 2021-09-24 ENCOUNTER — Ambulatory Visit (HOSPITAL_COMMUNITY)
Admission: RE | Admit: 2021-09-24 | Discharge: 2021-09-24 | Disposition: A | Discharge status: Home / Self Care | Payer: BC Managed Care – PPO | Source: Ambulatory Visit | Attending: Gastroenterology | Admitting: Gastroenterology

## 2021-09-24 DIAGNOSIS — K648 Other hemorrhoids: Secondary | ICD-10-CM | POA: Insufficient documentation

## 2021-09-24 DIAGNOSIS — S31831A Laceration without foreign body of anus, initial encounter: Secondary | ICD-10-CM | POA: Insufficient documentation

## 2021-09-24 DIAGNOSIS — X58XXXA Exposure to other specified factors, initial encounter: Secondary | ICD-10-CM | POA: Insufficient documentation

## 2021-09-24 DIAGNOSIS — K621 Rectal polyp: Secondary | ICD-10-CM | POA: Insufficient documentation

## 2021-09-24 DIAGNOSIS — Z1211 Encounter for screening for malignant neoplasm of colon: Secondary | ICD-10-CM | POA: Insufficient documentation

## 2021-09-24 DIAGNOSIS — Z6841 Body Mass Index (BMI) 40.0 and over, adult: Secondary | ICD-10-CM | POA: Diagnosis not present

## 2021-09-24 HISTORY — PX: COLONOSCOPY WITH PROPOFOL: SHX5780

## 2021-09-24 HISTORY — PX: POLYPECTOMY: SHX5525

## 2021-09-24 SURGERY — COLONOSCOPY WITH PROPOFOL
Anesthesia: Monitor Anesthesia Care

## 2021-09-24 MED ORDER — PROPOFOL 10 MG/ML IV BOLUS
INTRAVENOUS | Status: AC
  Filled 2021-09-24: qty 20

## 2021-09-24 MED ORDER — PROPOFOL 10 MG/ML IV BOLUS
INTRAVENOUS | Status: AC
Start: 2021-09-24 — End: ?
  Filled 2021-09-24: qty 20

## 2021-09-24 MED ORDER — LIDOCAINE 2% (20 MG/ML) 5 ML SYRINGE
INTRAMUSCULAR | Status: DC | PRN
Start: 2021-09-24 — End: 2021-09-24
  Administered 2021-09-24: 100 mg via INTRAVENOUS

## 2021-09-24 MED ORDER — PROPOFOL 500 MG/50ML IV EMUL
INTRAVENOUS | Status: DC | PRN
  Administered 2021-09-24: 150 ug/kg/min via INTRAVENOUS

## 2021-09-24 MED ORDER — PROPOFOL 1000 MG/100ML IV EMUL
INTRAVENOUS | Status: AC
  Filled 2021-09-24: qty 100

## 2021-09-24 MED ORDER — LACTATED RINGERS IV SOLN: INTRAVENOUS | Status: DC

## 2021-09-24 MED ORDER — SODIUM CHLORIDE 0.9 % IV SOLN: INTRAVENOUS | Status: DC

## 2021-09-24 MED ORDER — PROPOFOL 10 MG/ML IV BOLUS
INTRAVENOUS | Status: DC | PRN
  Administered 2021-09-24: 30 mg via INTRAVENOUS
  Administered 2021-09-24: 80 mg via INTRAVENOUS
  Administered 2021-09-24: 30 mg via INTRAVENOUS

## 2021-09-24 SURGICAL SUPPLY — 22 items

## 2021-09-24 NOTE — Op Note (Signed)
[facility] ?Patient Name: Karen Trevino ?Procedure Date: 09/24/2021 ?MRN: [id number] ?Attending MD: Kerin Salen , MD ?Date of Birth: 10-22-1975 ?CSN: [id number] ?Age: 46 ?Admit Type: Outpatient ?Procedure:                Colonoscopy ?Indications:              Screening for colorectal malignant neoplasm, This  ?                          is the patient's first colonoscopy ?Providers:                Kerin Salen, MD, Rip Harbour, RN, Ethelene Browns  ?                          Cheryll Dessert, Technician ?Referring MD:             Durwin Reges ?Medicines:                Monitored Anesthesia Care ?Complications:            No immediate complications. Estimated blood loss:  ?                          Minimal. ?Estimated Blood Loss:     Estimated blood loss was minimal. ?Procedure:                Pre-Anesthesia Assessment: ?                          - Prior to the procedure, a History and Physical  ?                          was performed, and patient medications and  ?                          allergies were reviewed. The patient's tolerance of  ?                          previous anesthesia was also reviewed. The risks  ?                          and benefits of the procedure and the sedation  ?                          options and risks were discussed with the patient.  ?                          All questions were answered, and informed consent  ?                          was obtained. Prior Anticoagulants: The patient has  ?                          taken no previous anticoagulant or antiplatelet  ?                          agents. ASA Grade Assessment:  III - A patient with  ?                          severe systemic disease. After reviewing the risks  ?                          and benefits, the patient was deemed in  ?                          satisfactory condition to undergo the procedure. ?                          After obtaining informed consent, the colonoscope  ?                          was passed  under direct vision. Throughout the  ?                          procedure, the patient's blood pressure, pulse, and  ?                          oxygen saturations were monitored continuously. The  ?                          CF-HQ190L ([id number]) Olympus colonoscope was  ?                          introduced through the anus and advanced to the the  ?                          terminal ileum. The colonoscopy was performed  ?                          without difficulty. The patient tolerated the  ?                          procedure well. The quality of the bowel  ?                          preparation was good. ?Scope In: 7:43:49 AM ?Scope Out: 8:00:05 AM ?Scope Withdrawal Time: 0 hours 11 minutes 48 seconds  ?Total Procedure Duration: 0 hours 16 minutes 16 seconds  ?Findings: ?     The terminal ileum appeared normal. ?     A 3 mm polyp was found in the rectum. The polyp was sessile. The polyp  ?     was removed with a cold biopsy forceps. Resection and retrieval were  ?     complete. ?     Non-bleeding internal hemorrhoids were found during retroflexion. The  ?     hemorrhoids were small. ?     The exam was otherwise without abnormality. ?     The perianal exam findings include small skin tear. ?Impression:               - The examined portion of the ileum was normal. ?                          -  One 3 mm polyp in the rectum, removed with a cold  ?                          biopsy forceps. Resected and retrieved. ?                          - Non-bleeding internal hemorrhoids. ?                          - The examination was otherwise normal. ?                          - Small skin tear found on perianal exam. ?Moderate Sedation: ?     Patient did not receive moderate sedation for this procedure, but  ?     instead received monitored anesthesia care. ?Recommendation:           - Patient has a contact number available for  ?                          emergencies. The signs and symptoms of potential  ?                           delayed complications were discussed with the  ?                          patient. Return to normal activities tomorrow.  ?                          Written discharge instructions were provided to the  ?                          patient. ?                          - Resume regular diet. ?                          - Continue present medications. ?                          - Await pathology results. ?                          - Repeat colonoscopy for surveillance based on  ?                          pathology results. ?Procedure Code(s):        --- Professional --- ?                          [id number], Colonoscopy, flexible; with biopsy, single  ?                          or multiple ?Diagnosis Code(s):        --- Professional --- ?  Z12.11, Encounter for screening for malignant  ?                          neoplasm of colon ?                          K64.8, Other hemorrhoids ?                          K62.1, Rectal polyp ?CPT copyright 2019 [employer]. All rights reserved. ?The codes documented in this report are preliminary and upon coder review may  ?be revised to meet current compliance requirements. ?Kerin Salen, MD ?09/24/2021 8:04:01 AM ?This report has been signed electronically. ?Number of Addenda: 0 ?

## 2021-09-24 NOTE — Discharge Instructions (Signed)

## 2021-09-24 NOTE — Transfer of Care (Signed)
Immediate Anesthesia Transfer of Care Note ? ?Patient: Karen Trevino ? ?Procedure(s) Performed: COLONOSCOPY WITH PROPOFOL ?POLYPECTOMY ? ?Patient Location: Endoscopy Unit ? ?Anesthesia Type:MAC ? ?Level of Consciousness: awake, alert , oriented and patient cooperative ? ?Airway & Oxygen Therapy: Patient Spontanous Breathing and Patient connected to face mask oxygen ? ?Post-op Assessment: Report given to RN, Post -op Vital signs reviewed and stable and Patient moving all extremities ? ?Post vital signs: Reviewed and stable ? ?Last Vitals:  ?Vitals Value Taken Time  ?BP    ?Temp    ?Pulse    ?Resp    ?SpO2    ? ? ?Last Pain:  ?Vitals:  ? 09/24/21 0648  ?TempSrc: Temporal  ?PainSc: 0-No pain  ?   ? ?  ? ?Complications: No notable events documented. ?

## 2021-09-24 NOTE — Interval H&P Note (Signed)
History and Physical Interval Note: ? ?09/24/2021 ?7:35 AM ? ?Karen Trevino  has presented today for colonoscopy, with the diagnosis of Colon Cancer Screening.  The various methods of treatment have been discussed with the patient and family. After consideration of risks, benefits and other options for treatment, the patient has consented to  Procedure(s): ?COLONOSCOPY WITH PROPOFOL (N/A) as a surgical intervention.  The patient's history has been reviewed, patient examined, no change in status, stable for surgery.  I have reviewed the patient's chart and labs.  Questions were answered to the patient's satisfaction.   ? ? ?Kerin Salen ? ? ?

## 2021-09-24 NOTE — Anesthesia Postprocedure Evaluation (Signed)
Anesthesia Post Note ? ?Patient: Karen Trevino ? ?Procedure(s) Performed: COLONOSCOPY WITH PROPOFOL ?POLYPECTOMY ? ?  ? ?Patient location during evaluation: Endoscopy ?Anesthesia Type: MAC ?Level of consciousness: awake and alert ?Pain management: pain level controlled ?Vital Signs Assessment: post-procedure vital signs reviewed and stable ?Respiratory status: spontaneous breathing, nonlabored ventilation, respiratory function stable and patient connected to nasal cannula oxygen ?Cardiovascular status: blood pressure returned to baseline and stable ?Postop Assessment: no apparent nausea or vomiting ?Anesthetic complications: no ? ? ?No notable events documented. ? ?Last Vitals:  ?Vitals:  ? 09/24/21 0820 09/24/21 0830  ?BP: 114/65 123/72  ?Pulse: 65 64  ?Resp: (!) 22 18  ?Temp:    ?SpO2: 100% 100%  ?  ?Last Pain:  ?Vitals:  ? 09/24/21 0830  ?TempSrc:   ?PainSc: 0-No pain  ? ? ?  ?  ?  ?  ?  ?  ? ?Barnet Glasgow ? ? ? ? ?

## 2021-09-26 LAB — SURGICAL PATHOLOGY

## 2022-02-13 IMAGING — US US PELVIS COMPLETE WITH TRANSVAGINAL
1 series · 14 of 25 positions shown · non-contrast
Comparison: None

CLINICAL DATA: Assess for IUD location

EXAM:
TRANSABDOMINAL AND TRANSVAGINAL ULTRASOUND OF PELVIS
TECHNIQUE: Both transabdominal and transvaginal ultrasound examinations of the
pelvis were performed. Transabdominal technique was performed for
global imaging of the pelvis including uterus, ovaries, adnexal
regions, and pelvic cul-de-sac. It was necessary to proceed with
endovaginal exam following the transabdominal exam to visualize the
uterus and adnexae in better detail.

[Series 1: us pelvis complete with transvaginal · 0.36mm/px · 14 of 52 slices shown]
[im 1/52]
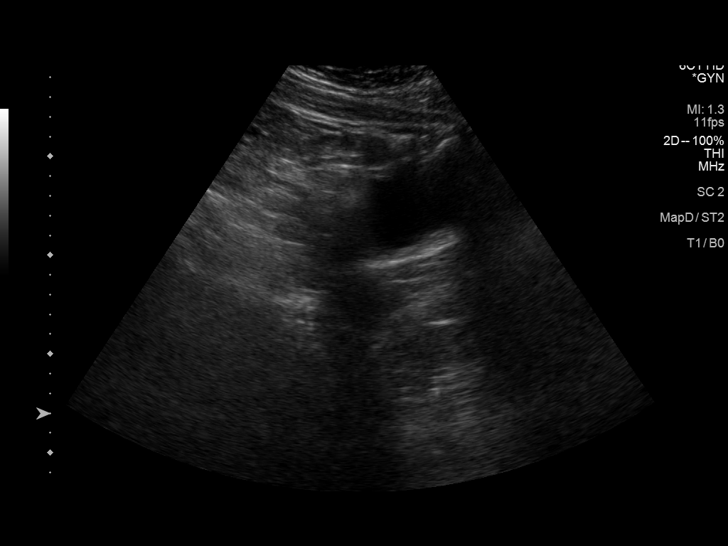
[im 5/52]
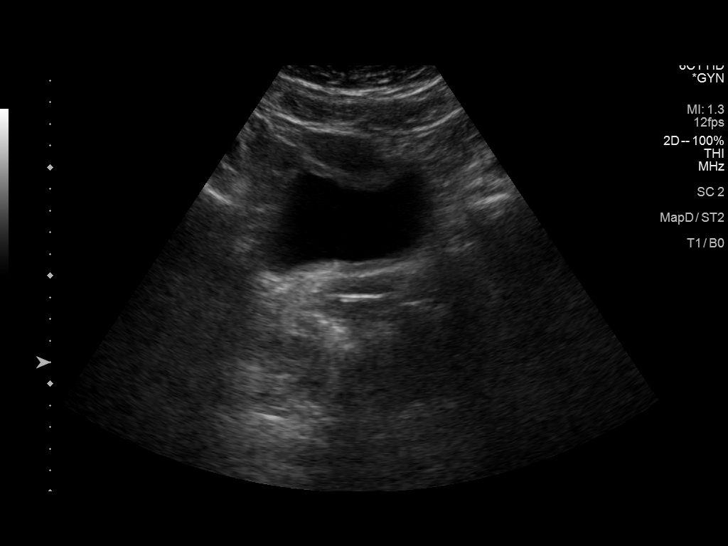
[im 9/52]
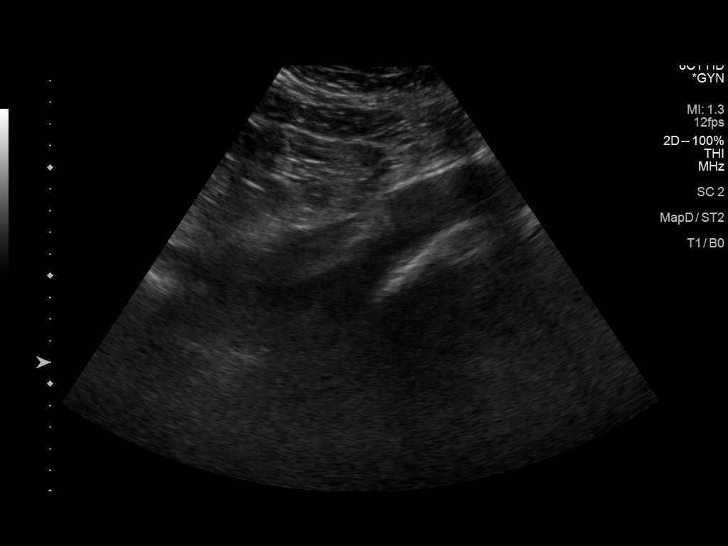
[im 13/52]
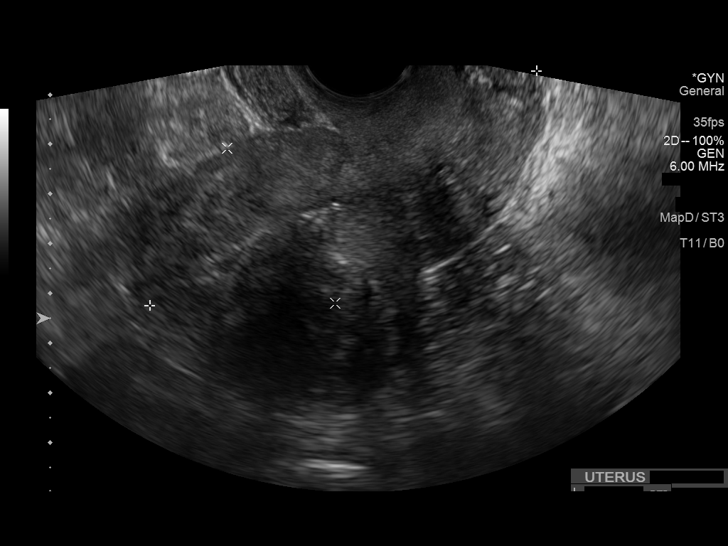
[im 18/52]
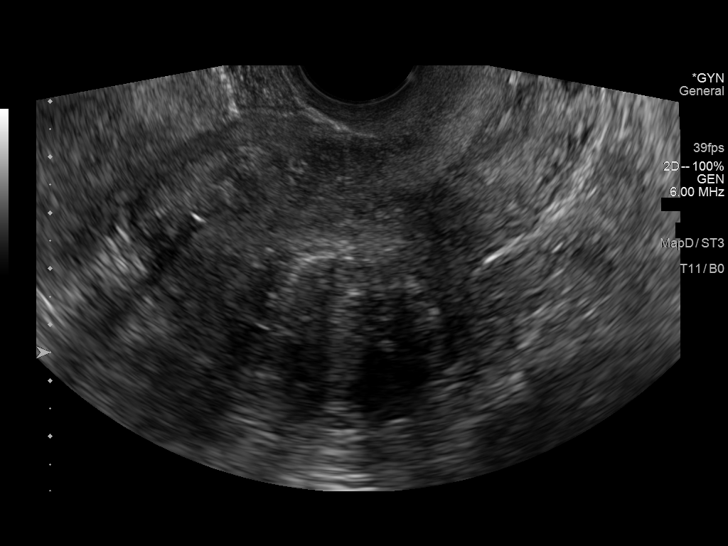
[im 20/52]
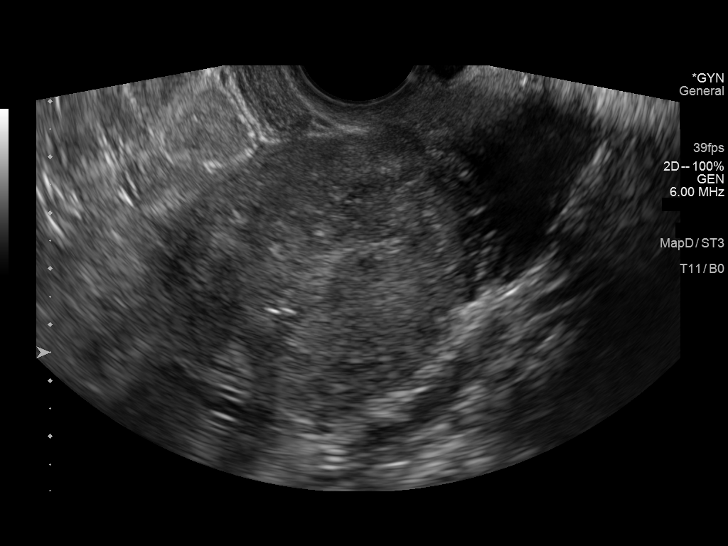
[im 24/52]
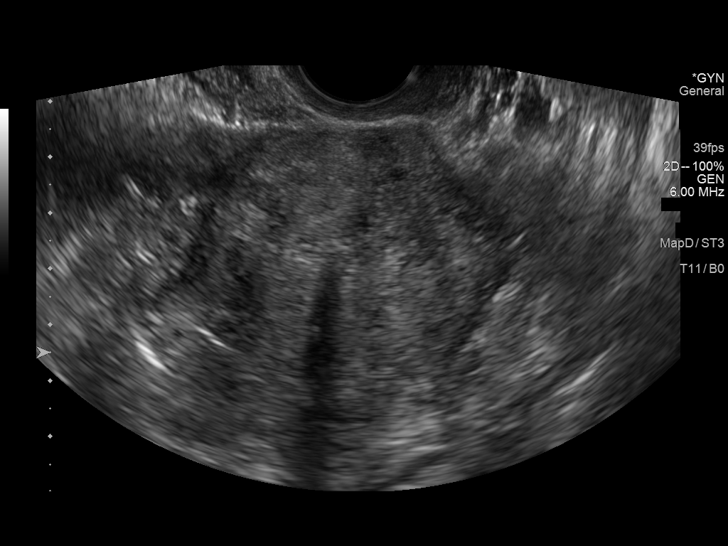
[im 28/52]
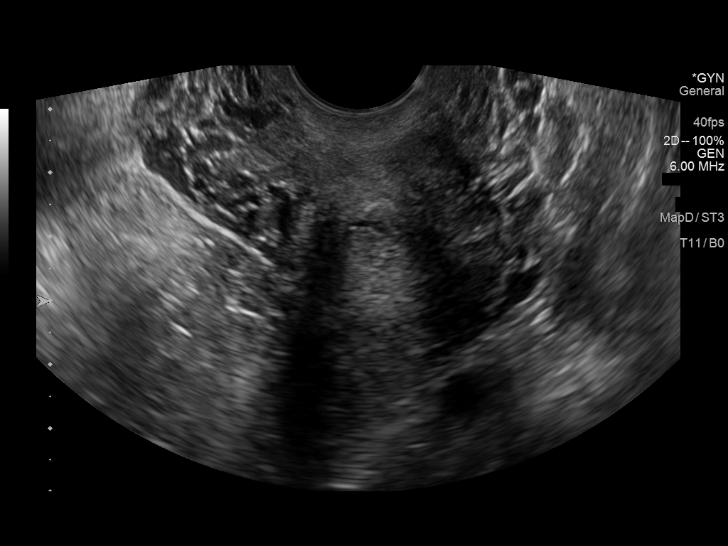
[im 32/52]
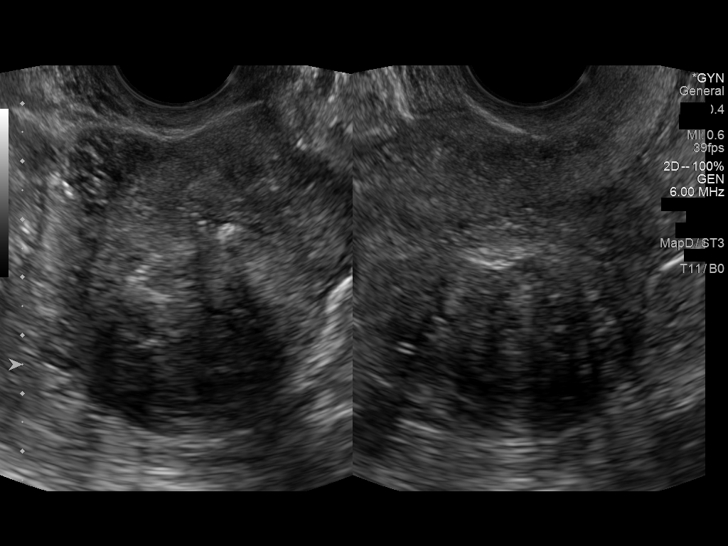
[im 35/52]
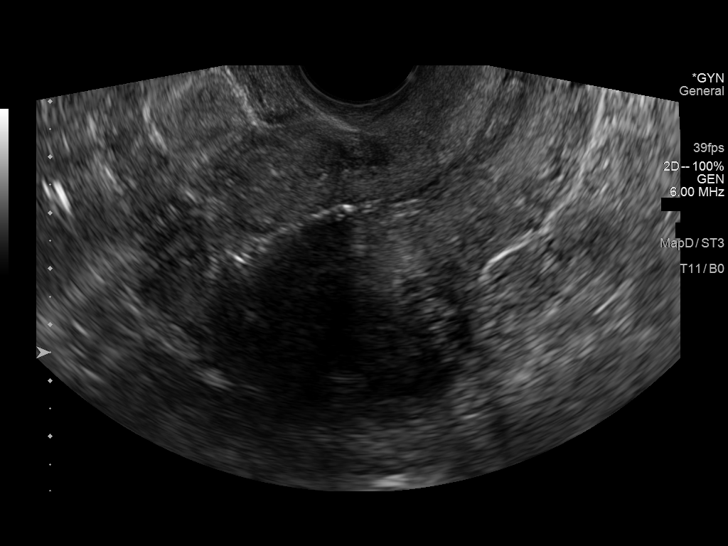
[im 39/52]
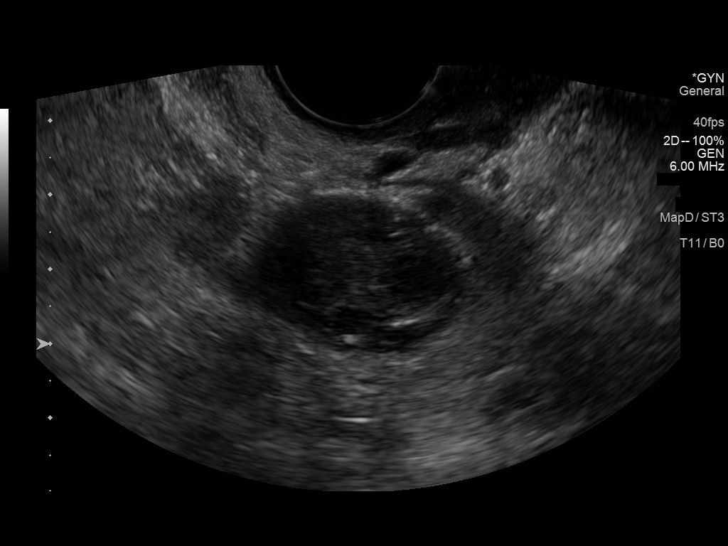
[im 43/52]
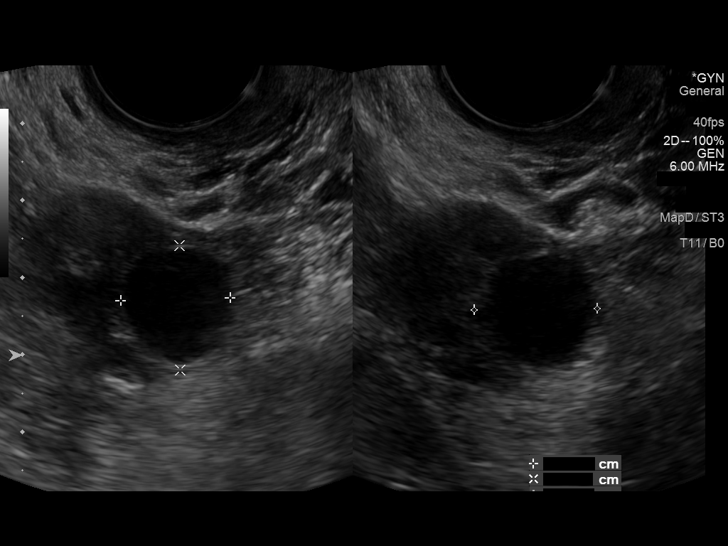
[im 47/52]
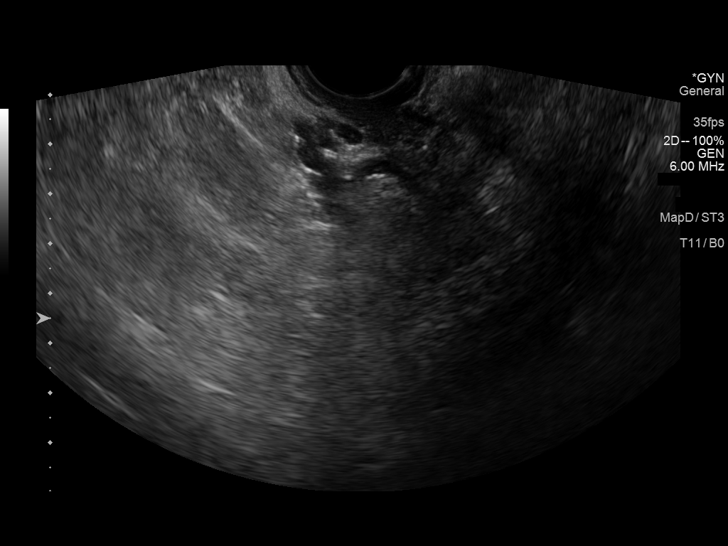
[im 52/52]
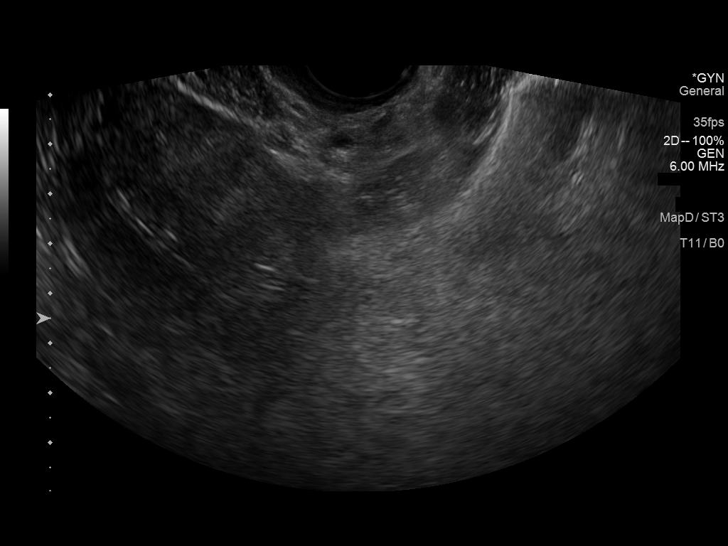

[14 of 25 positions shown; findings below may reference images not displayed]

FINDINGS: Uterus

Measurements: 9.1 x 3.8 x 5.0 cm = volume: 89 mL. Posterior uterine
fibroid measures 3.7 x 2.7 x 3.2 cm.

Endometrium

Thickness: 4.4 mm. Echogenic linear shadowing foreign body within
the endometrial cavity compatible with an appropriately positioned
IUD.

Right ovary

Measurements: 3.1 x 1.9 x 3.4 cm. = volume: 10 mL. Dominant right
ovarian follicle measures 1.6 cm.

Left ovary

Not visualized, obscured by bowel gas

Other findings

No abnormal free fluid.
IMPRESSION: Appropriately positioned IUD within the endometrial cavity.

3.7 cm posterior uterine fibroid

Nonvisualized left ovary

No free fluid

## 2022-02-17 DIAGNOSIS — Z0000 Encounter for general adult medical examination without abnormal findings: Secondary | ICD-10-CM | POA: Diagnosis not present

## 2022-02-17 DIAGNOSIS — Z13220 Encounter for screening for lipoid disorders: Secondary | ICD-10-CM | POA: Diagnosis not present

## 2022-02-17 DIAGNOSIS — R7303 Prediabetes: Secondary | ICD-10-CM | POA: Diagnosis not present

## 2022-02-17 DIAGNOSIS — Z131 Encounter for screening for diabetes mellitus: Secondary | ICD-10-CM | POA: Diagnosis not present

## 2022-03-21 ENCOUNTER — Other Ambulatory Visit: Payer: Self-pay | Admitting: Nurse Practitioner

## 2022-03-21 DIAGNOSIS — Z01419 Encounter for gynecological examination (general) (routine) without abnormal findings: Secondary | ICD-10-CM | POA: Diagnosis not present

## 2022-03-21 DIAGNOSIS — Z124 Encounter for screening for malignant neoplasm of cervix: Secondary | ICD-10-CM | POA: Diagnosis not present

## 2022-03-21 DIAGNOSIS — Z1151 Encounter for screening for human papillomavirus (HPV): Secondary | ICD-10-CM | POA: Diagnosis not present

## 2022-03-21 DIAGNOSIS — Z30431 Encounter for routine checking of intrauterine contraceptive device: Secondary | ICD-10-CM

## 2022-03-27 ENCOUNTER — Other Ambulatory Visit: Payer: BC Managed Care – PPO

## 2022-09-10 DIAGNOSIS — Z1231 Encounter for screening mammogram for malignant neoplasm of breast: Secondary | ICD-10-CM | POA: Diagnosis not present

## 2023-02-24 DIAGNOSIS — Z0000 Encounter for general adult medical examination without abnormal findings: Secondary | ICD-10-CM | POA: Diagnosis not present

## 2023-02-24 DIAGNOSIS — R7303 Prediabetes: Secondary | ICD-10-CM | POA: Diagnosis not present

## 2023-02-24 DIAGNOSIS — Z131 Encounter for screening for diabetes mellitus: Secondary | ICD-10-CM | POA: Diagnosis not present

## 2023-03-27 ENCOUNTER — Other Ambulatory Visit: Payer: Self-pay | Admitting: Nurse Practitioner

## 2023-03-27 DIAGNOSIS — Z01419 Encounter for gynecological examination (general) (routine) without abnormal findings: Secondary | ICD-10-CM | POA: Diagnosis not present

## 2023-03-27 DIAGNOSIS — Z30431 Encounter for routine checking of intrauterine contraceptive device: Secondary | ICD-10-CM

## 2023-04-02 ENCOUNTER — Ambulatory Visit
Admission: RE | Admit: 2023-04-02 | Discharge: 2023-04-02 | Disposition: A | Discharge status: Home / Self Care | Payer: BC Managed Care – PPO | Source: Ambulatory Visit | Attending: Nurse Practitioner | Admitting: Nurse Practitioner

## 2023-04-02 DIAGNOSIS — D259 Leiomyoma of uterus, unspecified: Secondary | ICD-10-CM | POA: Diagnosis not present

## 2023-04-02 DIAGNOSIS — Z30431 Encounter for routine checking of intrauterine contraceptive device: Secondary | ICD-10-CM

## 2023-04-02 DIAGNOSIS — T8332XA Displacement of intrauterine contraceptive device, initial encounter: Secondary | ICD-10-CM | POA: Diagnosis not present

## 2023-05-04 ENCOUNTER — Ambulatory Visit
Admission: EM | Admit: 2023-05-04 | Discharge: 2023-05-04 | Disposition: A | Discharge status: Home / Self Care | Payer: BC Managed Care – PPO | Attending: Family Medicine | Admitting: Family Medicine

## 2023-05-04 DIAGNOSIS — N390 Urinary tract infection, site not specified: Secondary | ICD-10-CM | POA: Diagnosis not present

## 2023-05-04 LAB — POCT URINALYSIS DIP (MANUAL ENTRY)
Bilirubin, UA: NEGATIVE
Glucose, UA: NEGATIVE mg/dL
Nitrite, UA: NEGATIVE
Protein Ur, POC: 30 mg/dL — AB
Spec Grav, UA: >=1.03 — AB (ref 1.010–1.025)
Urobilinogen, UA: 0.2 U/dL
pH, UA: 5.5 (ref 5.0–8.0)

## 2023-05-04 LAB — POCT URINE PREGNANCY: Preg Test, Ur: NEGATIVE

## 2023-05-04 MED ORDER — CEPHALEXIN 500 MG PO CAPS: 500.0000 mg | ORAL_CAPSULE | Freq: Two times a day (BID) | ORAL | 0 refills | Status: AC

## 2023-05-04 NOTE — ED Triage Notes (Signed)
Urinary frequency, discomfort with urination, lower abdominal cramping that started 2 days ago.

## 2023-05-04 NOTE — ED Provider Notes (Signed)
[facility]    CSN: [id number] Arrival date & time: 05/04/23  1658      History   Chief Complaint Chief Complaint  Patient presents with   Urinary Frequency    HPI Karen Trevino is a 47 y.o. female.   Patient presenting today with 2-day history of urinary frequency, urgency, lower abdominal cramping.  Denies fever, chills, nausea, vomiting, bowel changes, vaginal symptoms.  So far trying over-the-counter pain relievers with mild temporary benefit.  LMP 04/27/2023.    History reviewed. No pertinent past medical history.  There are no problems to display for this patient.   Past Surgical History:  Procedure Laterality Date   COLONOSCOPY WITH PROPOFOL N/A 09/24/2021   Procedure: COLONOSCOPY WITH PROPOFOL;  Surgeon: Kerin Salen, MD;  Location: [facility];  Service: Gastroenterology;  Laterality: N/A;   POLYPECTOMY  09/24/2021   Procedure: POLYPECTOMY;  Surgeon: Kerin Salen, MD;  Location: [facility];  Service: Gastroenterology;;    OB History   No obstetric history on file.      Home Medications    Prior to Admission medications   Medication Sig Start Date End Date Taking? Authorizing Provider  cephALEXin (KEFLEX) 500 MG capsule Take 1 capsule (500 mg total) by mouth 2 (two) times daily. 05/04/23  Yes Particia Nearing, PA-C  cetirizine (ZYRTEC) 10 MG tablet Take 10 mg by mouth daily as needed for allergies.    [provider]  ibuprofen (ADVIL) 200 MG tablet Take 400 mg by mouth every 8 (eight) hours as needed (for pain.).    [provider]  levonorgestrel (MIRENA) 20 MCG/DAY IUD 1 each by Intrauterine route once.    [provider]    Family History History reviewed. No pertinent family history.  Social History Social History   Tobacco Use   Smoking status: Never   Smokeless tobacco: Never  Vaping Use   Vaping status: Never Used  Substance Use Topics   Alcohol use: No   Drug use: No     Allergies    Patient has no known allergies.   Review of Systems Review of Systems Per HPI  Physical Exam Triage Vital Signs ED Triage Vitals [05/04/23 1709]  Encounter Vitals Group     BP 131/85     Systolic BP Percentile      Diastolic BP Percentile      Pulse Rate 85     Resp 16     Temp 98.1 F (36.7 C)     Temp Source Oral     SpO2 97 %     Weight      Height      Head Circumference      Peak Flow      Pain Score 3     Pain Loc      Pain Education      Exclude from Growth Chart    No data found.  Updated Vital Signs BP 131/85 (BP Location: Right Arm)   Pulse 85   Temp 98.1 F (36.7 C) (Oral)   Resp 16   LMP 04/27/2023 (Approximate)   SpO2 97%   Visual Acuity Right Eye Distance:   Left Eye Distance:   Bilateral Distance:    Right Eye Near:   Left Eye Near:    Bilateral Near:     Physical Exam Vitals and nursing note reviewed.  Constitutional:      Appearance: Normal appearance. She is not ill-appearing.  HENT:     Head:  Atraumatic.  Eyes:     Extraocular Movements: Extraocular movements intact.     Conjunctiva/sclera: Conjunctivae normal.  Cardiovascular:     Rate and Rhythm: Normal rate and regular rhythm.     Heart sounds: Normal heart sounds.  Pulmonary:     Effort: Pulmonary effort is normal.     Breath sounds: Normal breath sounds.  Abdominal:     General: Bowel sounds are normal. There is no distension.     Palpations: Abdomen is soft.     Tenderness: There is no abdominal tenderness. There is no right CVA tenderness, left CVA tenderness or guarding.  Musculoskeletal:        General: Normal range of motion.     Cervical back: Normal range of motion and neck supple.  Skin:    General: Skin is warm and dry.  Neurological:     Mental Status: She is alert and oriented to person, place, and time.  Psychiatric:        Mood and Affect: Mood normal.        Thought Content: Thought content normal.        Judgment: Judgment normal.      UC  Treatments / Results  Labs (all labs ordered are listed, but only abnormal results are displayed) Labs Reviewed  POCT URINALYSIS DIP (MANUAL ENTRY) - Abnormal; Notable for the following components:      Result Value   Clarity, UA cloudy (*)    Ketones, POC UA trace (5) (*)    Spec Grav, UA >=1.030 (*)    Blood, UA moderate (*)    Protein Ur, POC =30 (*)    Leukocytes, UA Small (1+) (*)    All other components within normal limits  URINE CULTURE  POCT URINE PREGNANCY    EKG   Radiology No results found.  Procedures Procedures (including critical care time)  Medications Ordered in UC Medications - No data to display  Initial Impression / Assessment and Plan / UC Course  I have reviewed the triage vital signs and the nursing notes.  Pertinent labs & imaging results that were available during my care of the patient were reviewed by me and considered in my medical decision making (see chart for details).     Vitals and exam reassuring today, urinalysis with evidence of a urinary tract infection.  Treat with Keflex and await urine culture, adjust if needed.  Return for worsening symptoms.  Final Clinical Impressions(s) / UC Diagnoses   Final diagnoses:  Acute lower UTI   Discharge Instructions   None    ED Prescriptions     Medication Sig Dispense Auth. Provider   cephALEXin (KEFLEX) 500 MG capsule Take 1 capsule (500 mg total) by mouth 2 (two) times daily. 10 capsule Particia Nearing, [place]      PDMP not reviewed this encounter.   Particia Nearing, [place] 05/04/23 1751

## 2023-05-06 LAB — URINE CULTURE: Culture: 10000 — AB

## 2023-09-01 DIAGNOSIS — R300 Dysuria: Secondary | ICD-10-CM | POA: Diagnosis not present

## 2023-09-01 DIAGNOSIS — R319 Hematuria, unspecified: Secondary | ICD-10-CM | POA: Diagnosis not present

## 2023-09-08 DIAGNOSIS — Z6841 Body Mass Index (BMI) 40.0 and over, adult: Secondary | ICD-10-CM | POA: Diagnosis not present

## 2023-09-08 DIAGNOSIS — L03119 Cellulitis of unspecified part of limb: Secondary | ICD-10-CM | POA: Diagnosis not present

## 2023-09-08 DIAGNOSIS — R030 Elevated blood-pressure reading, without diagnosis of hypertension: Secondary | ICD-10-CM | POA: Diagnosis not present

## 2023-09-09 ENCOUNTER — Other Ambulatory Visit: Payer: Self-pay | Admitting: Physician Assistant

## 2023-09-09 DIAGNOSIS — R319 Hematuria, unspecified: Secondary | ICD-10-CM

## 2023-09-16 DIAGNOSIS — Z1231 Encounter for screening mammogram for malignant neoplasm of breast: Secondary | ICD-10-CM | POA: Diagnosis not present

## 2023-09-16 DIAGNOSIS — L0390 Cellulitis, unspecified: Secondary | ICD-10-CM | POA: Diagnosis not present

## 2023-09-16 DIAGNOSIS — R310 Gross hematuria: Secondary | ICD-10-CM | POA: Diagnosis not present

## 2023-10-02 ENCOUNTER — Ambulatory Visit
Admission: RE | Admit: 2023-10-02 | Discharge: 2023-10-02 | Disposition: A | Discharge status: Home / Self Care | Source: Ambulatory Visit | Attending: Physician Assistant | Admitting: Physician Assistant

## 2023-10-02 DIAGNOSIS — Z975 Presence of (intrauterine) contraceptive device: Secondary | ICD-10-CM | POA: Diagnosis not present

## 2023-10-02 DIAGNOSIS — D259 Leiomyoma of uterus, unspecified: Secondary | ICD-10-CM | POA: Diagnosis not present

## 2023-10-02 DIAGNOSIS — R319 Hematuria, unspecified: Secondary | ICD-10-CM | POA: Diagnosis not present

## 2023-10-02 MED ORDER — IOPAMIDOL (ISOVUE-300) INJECTION 61%
125.0000 mL | Freq: Once | INTRAVENOUS | Status: AC | PRN
  Administered 2023-10-02: 125 mL via INTRAVENOUS

## 2024-03-02 DIAGNOSIS — R7303 Prediabetes: Secondary | ICD-10-CM | POA: Diagnosis not present

## 2024-03-02 DIAGNOSIS — Z0000 Encounter for general adult medical examination without abnormal findings: Secondary | ICD-10-CM | POA: Diagnosis not present

## 2024-03-02 DIAGNOSIS — Z13220 Encounter for screening for lipoid disorders: Secondary | ICD-10-CM | POA: Diagnosis not present

## 2024-05-03 DIAGNOSIS — Z01419 Encounter for gynecological examination (general) (routine) without abnormal findings: Secondary | ICD-10-CM | POA: Diagnosis not present

## 2024-05-03 DIAGNOSIS — Z124 Encounter for screening for malignant neoplasm of cervix: Secondary | ICD-10-CM | POA: Diagnosis not present

## 2024-05-03 DIAGNOSIS — Z1151 Encounter for screening for human papillomavirus (HPV): Secondary | ICD-10-CM | POA: Diagnosis not present

## 2024-05-23 DIAGNOSIS — E559 Vitamin D deficiency, unspecified: Secondary | ICD-10-CM | POA: Diagnosis not present

## 2024-05-23 DIAGNOSIS — R7303 Prediabetes: Secondary | ICD-10-CM | POA: Diagnosis not present

## 2024-05-23 DIAGNOSIS — E8889 Other specified metabolic disorders: Secondary | ICD-10-CM | POA: Diagnosis not present

## 2024-05-23 DIAGNOSIS — Z1331 Encounter for screening for depression: Secondary | ICD-10-CM | POA: Diagnosis not present

## 2024-05-23 DIAGNOSIS — E66813 Obesity, class 3: Secondary | ICD-10-CM | POA: Diagnosis not present

## 2024-05-23 DIAGNOSIS — R635 Abnormal weight gain: Secondary | ICD-10-CM | POA: Diagnosis not present

## 2024-06-06 DIAGNOSIS — E559 Vitamin D deficiency, unspecified: Secondary | ICD-10-CM | POA: Diagnosis not present

## 2024-06-06 DIAGNOSIS — Z6841 Body Mass Index (BMI) 40.0 and over, adult: Secondary | ICD-10-CM | POA: Diagnosis not present

## 2024-06-06 DIAGNOSIS — R7303 Prediabetes: Secondary | ICD-10-CM | POA: Diagnosis not present

## 2024-06-06 DIAGNOSIS — E66813 Obesity, class 3: Secondary | ICD-10-CM | POA: Diagnosis not present

## 2024-06-20 DIAGNOSIS — E66813 Obesity, class 3: Secondary | ICD-10-CM | POA: Diagnosis not present

## 2024-06-20 DIAGNOSIS — Z6841 Body Mass Index (BMI) 40.0 and over, adult: Secondary | ICD-10-CM | POA: Diagnosis not present

## 2024-06-20 DIAGNOSIS — R7303 Prediabetes: Secondary | ICD-10-CM | POA: Diagnosis not present

## 2024-06-20 DIAGNOSIS — E559 Vitamin D deficiency, unspecified: Secondary | ICD-10-CM | POA: Diagnosis not present
